# Patient Record
Sex: Female | Born: 1995 | Race: Black or African American | Hispanic: No | Marital: Single | Smoking: Former smoker
Health system: Southern US, Community
[De-identification: ages and names within clinical notes are randomized; demographics above are authoritative.]

## PROBLEM LIST (undated history)

## (undated) ENCOUNTER — Inpatient Hospital Stay: Payer: Self-pay

## (undated) DIAGNOSIS — E669 Obesity, unspecified: Secondary | ICD-10-CM

## (undated) DIAGNOSIS — F32A Depression, unspecified: Secondary | ICD-10-CM

## (undated) DIAGNOSIS — F319 Bipolar disorder, unspecified: Secondary | ICD-10-CM

## (undated) DIAGNOSIS — E66811 Obesity, class 1: Secondary | ICD-10-CM

## (undated) DIAGNOSIS — F419 Anxiety disorder, unspecified: Secondary | ICD-10-CM

---

## 2014-12-05 DIAGNOSIS — R45851 Suicidal ideations: Secondary | ICD-10-CM | POA: Insufficient documentation

## 2014-12-05 DIAGNOSIS — F319 Bipolar disorder, unspecified: Secondary | ICD-10-CM | POA: Insufficient documentation

## 2015-03-15 NOTE — L&D Delivery Note (Addendum)
Delivery Note EDC: 01/16/16 LMP 04/11/15  EGA: 40.3   At 2:44 AM a viable female was delivered via Vaginal, Spontaneous Delivery (Presentation: cephalic).  APGAR: 8, 9; weight 7 lb 1 oz (3204 g).   Placenta status: sponatneous, intact.  Cord: 3 vessels,  with the following complications: none apparent .  Cord pH: not taken  Anesthesia:  none Episiotomy:  none Lacerations:  none Suture Repair: none Est. Blood Loss (mL):  600cc  Mom to postpartum.  Baby to Couplet care / Skin to Skin.  Patient presented with discomfort and was found to be 2cm, as she was in her office visit earlier in the day.  She was given morphine/phenergan for morphine rest.  She slept for a short time and then became more uncomfortable.  She was found to be 5cm, but within 4 minutes she spontaneously ruptured and the baby delivered in the bed. Cord blood was collected. The placenta delivered spontaneously in a 15 minute third stage.  There was some brisk bleeding then clotting after IV pitocin was bolused. Methergine 0.2mg  was given and bleeding became minimal.  Mom and baby recovered skin-to-skin and we sang Happy Birthday to [employer] Baby Caleb and Nurse Huntley DecSara.  Due to precipitous nature, antibiotics were ordered but not given for GBS positive status.    Chelsea C Ward 01/19/2016, 3:11 AM

## 2015-11-05 LAB — OB RESULTS CONSOLE ANTIBODY SCREEN: ANTIBODY SCREEN: NEGATIVE

## 2015-12-01 LAB — OB RESULTS CONSOLE HIV ANTIBODY (ROUTINE TESTING): HIV: NONREACTIVE

## 2015-12-16 ENCOUNTER — Inpatient Hospital Stay
Admission: EM | Admit: 2015-12-16 | Discharge: 2015-12-16 | Disposition: A | Discharge status: Home / Self Care | Payer: Medicaid Other | Attending: Obstetrics and Gynecology | Admitting: Obstetrics and Gynecology

## 2015-12-16 DIAGNOSIS — Z3A35 35 weeks gestation of pregnancy: Secondary | ICD-10-CM | POA: Insufficient documentation

## 2015-12-16 DIAGNOSIS — O4703 False labor before 37 completed weeks of gestation, third trimester: Secondary | ICD-10-CM | POA: Insufficient documentation

## 2015-12-16 LAB — URINE DRUG SCREEN, QUALITATIVE (ARMC ONLY)
Amphetamines, Ur Screen: NOT DETECTED
BARBITURATES, UR SCREEN: NOT DETECTED
Benzodiazepine, Ur Scrn: NOT DETECTED
COCAINE METABOLITE, UR ~~LOC~~: NOT DETECTED
Cannabinoid 50 Ng, Ur ~~LOC~~: NOT DETECTED
MDMA (ECSTASY) UR SCREEN: NOT DETECTED
METHADONE SCREEN, URINE: NOT DETECTED
OPIATE, UR SCREEN: NOT DETECTED
Phencyclidine (PCP) Ur S: NOT DETECTED
TRICYCLIC, UR SCREEN: NOT DETECTED

## 2015-12-16 LAB — CBC
HEMATOCRIT: 31.5 % — AB (ref 35.0–47.0)
HEMOGLOBIN: 10.1 g/dL — AB (ref 12.0–16.0)
MCH: 24.2 pg — ABNORMAL LOW (ref 26.0–34.0)
MCHC: 32 g/dL (ref 32.0–36.0)
MCV: 75.6 fL — AB (ref 80.0–100.0)
Platelets: 199 10*3/uL (ref 150–440)
RBC: 4.17 MIL/uL (ref 3.80–5.20)
RDW: 15.4 % — ABNORMAL HIGH (ref 11.5–14.5)
WBC: 11.2 10*3/uL — AB (ref 3.6–11.0)

## 2015-12-16 LAB — RAPID HIV SCREEN (HIV 1/2 AB+AG)
HIV 1/2 Antibodies: NONREACTIVE
HIV-1 P24 ANTIGEN - HIV24: NONREACTIVE

## 2015-12-16 LAB — WET PREP, GENITAL
CLUE CELLS WET PREP: NONE SEEN
SPERM: NONE SEEN
TRICH WET PREP: NONE SEEN
Yeast Wet Prep HPF POC: NONE SEEN

## 2015-12-16 LAB — DIFFERENTIAL
Basophils Absolute: 0.2 10*3/uL — ABNORMAL HIGH (ref 0–0.1)
Basophils Relative: 2 %
Eosinophils Absolute: 0.1 10*3/uL (ref 0–0.7)
Eosinophils Relative: 1 %
LYMPHS ABS: 2 10*3/uL (ref 1.0–3.6)
LYMPHS PCT: 18 %
MONO ABS: 0.9 10*3/uL (ref 0.2–0.9)
MONOS PCT: 8 %
NEUTROS ABS: 8 10*3/uL — AB (ref 1.4–6.5)
Neutrophils Relative %: 71 %

## 2015-12-16 LAB — CHLAMYDIA/NGC RT PCR (ARMC ONLY)
Chlamydia Tr: NOT DETECTED
N gonorrhoeae: NOT DETECTED

## 2015-12-16 LAB — ABO/RH: ABO/RH(D): O POS

## 2015-12-16 MED ORDER — SODIUM CHLORIDE FLUSH 0.9 % IV SOLN
INTRAVENOUS | Status: AC
  Filled 2015-12-16: qty 10
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
## 2015-12-16 NOTE — Plan of Care (Signed)
Meredith s. cnm here to discuss treatment. Pt states her partner is HIV positive and on medication. Pt states she is supposed to have a csection. Due to the unknown, pt demands to have a csection, even though she has tested negative

## 2015-12-16 NOTE — Progress Notes (Signed)
m sigmon,cnm notified of pt's arrival and complaints.

## 2015-12-16 NOTE — Plan of Care (Signed)
Pt took herself off monitor to go to [facility].
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
## 2015-12-17 LAB — CULTURE, BETA STREP (GROUP B ONLY)

## 2015-12-17 LAB — RUBELLA SCREEN: RUBELLA: 2.41 {index} (ref >0.99)

## 2015-12-17 LAB — RPR: RPR: NONREACTIVE

## 2015-12-17 LAB — VARICELLA ZOSTER ANTIBODY, IGG: Varicella IgG: >4000 index (ref >165)

## 2015-12-17 LAB — HEPATITIS B SURFACE ANTIGEN: Hepatitis B Surface Ag: NEGATIVE
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
## 2016-01-18 ENCOUNTER — Inpatient Hospital Stay
Admission: EM | Admit: 2016-01-18 | Discharge: 2016-01-21 | DRG: 775 | Disposition: A | Discharge status: Home / Self Care | Payer: Medicaid Other | Attending: Obstetrics & Gynecology | Admitting: Obstetrics & Gynecology

## 2016-01-18 DIAGNOSIS — O99824 Streptococcus B carrier state complicating childbirth: Principal | ICD-10-CM | POA: Diagnosis present

## 2016-01-18 DIAGNOSIS — Z3A40 40 weeks gestation of pregnancy: Secondary | ICD-10-CM

## 2016-01-18 DIAGNOSIS — Z880 Allergy status to penicillin: Secondary | ICD-10-CM

## 2016-01-19 DIAGNOSIS — Z3A40 40 weeks gestation of pregnancy: Secondary | ICD-10-CM | POA: Diagnosis not present

## 2016-01-19 DIAGNOSIS — Z880 Allergy status to penicillin: Secondary | ICD-10-CM | POA: Diagnosis not present

## 2016-01-19 DIAGNOSIS — O99824 Streptococcus B carrier state complicating childbirth: Secondary | ICD-10-CM | POA: Diagnosis present

## 2016-01-19 DIAGNOSIS — Z3403 Encounter for supervision of normal first pregnancy, third trimester: Secondary | ICD-10-CM | POA: Diagnosis present

## 2016-01-19 LAB — CBC
HCT: 33.7 % — ABNORMAL LOW (ref 35.0–47.0)
Hemoglobin: 10.7 g/dL — ABNORMAL LOW (ref 12.0–16.0)
MCH: 23.4 pg — AB (ref 26.0–34.0)
MCHC: 31.7 g/dL — ABNORMAL LOW (ref 32.0–36.0)
MCV: 73.9 fL — ABNORMAL LOW (ref 80.0–100.0)
PLATELETS: 252 10*3/uL (ref 150–440)
RBC: 4.56 MIL/uL (ref 3.80–5.20)
RDW: 16.7 % — AB (ref 11.5–14.5)
WBC: 15.8 10*3/uL — AB (ref 3.6–11.0)

## 2016-01-19 LAB — TYPE AND SCREEN
ABO/RH(D): O POS
Antibody Screen: NEGATIVE

## 2016-01-19 MED ORDER — DIBUCAINE 1 % RE OINT: 1.0000 "application " | TOPICAL_OINTMENT | RECTAL | Status: DC | PRN

## 2016-01-19 MED ORDER — BUTORPHANOL TARTRATE 1 MG/ML IJ SOLN
1.0000 mg | Freq: Once | INTRAMUSCULAR | Status: AC
  Administered 2016-01-19: 1 mg via INTRAVENOUS

## 2016-01-19 MED ORDER — OXYCODONE-ACETAMINOPHEN 5-325 MG PO TABS: 1.0000 | ORAL_TABLET | ORAL | Status: DC | PRN

## 2016-01-19 MED ORDER — VANCOMYCIN HCL IN DEXTROSE 1-5 GM/200ML-% IV SOLN
1000.0000 mg | Freq: Once | INTRAVENOUS | Status: DC
  Filled 2016-01-19: qty 200

## 2016-01-19 MED ORDER — DOCUSATE SODIUM 100 MG PO CAPS
100.0000 mg | ORAL_CAPSULE | Freq: Two times a day (BID) | ORAL | Status: DC
  Administered 2016-01-20 – 2016-01-21 (×3): 100 mg via ORAL
  Filled 2016-01-19 (×4): qty 1

## 2016-01-19 MED ORDER — SOD CITRATE-CITRIC ACID 500-334 MG/5ML PO SOLN: 30.0000 mL | ORAL | Status: DC | PRN

## 2016-01-19 MED ORDER — WITCH HAZEL-GLYCERIN EX PADS
1.0000 | MEDICATED_PAD | CUTANEOUS | Status: DC | PRN
Start: 2016-01-19 — End: 2016-01-21

## 2016-01-19 MED ORDER — OXYTOCIN BOLUS FROM INFUSION: 500.0000 mL | Freq: Once | INTRAVENOUS | Status: DC

## 2016-01-19 MED ORDER — METHYLERGONOVINE MALEATE 0.2 MG/ML IJ SOLN
INTRAMUSCULAR | Status: AC
  Filled 2016-01-19: qty 1

## 2016-01-19 MED ORDER — SIMETHICONE 80 MG PO CHEW: 80.0000 mg | CHEWABLE_TABLET | ORAL | Status: DC | PRN

## 2016-01-19 MED ORDER — MEDROXYPROGESTERONE ACETATE 150 MG/ML IM SUSP: 150.0000 mg | Freq: Once | INTRAMUSCULAR | Status: DC

## 2016-01-19 MED ORDER — ONDANSETRON HCL 4 MG PO TABS: 4.0000 mg | ORAL_TABLET | ORAL | Status: DC | PRN

## 2016-01-19 MED ORDER — ACETAMINOPHEN 500 MG PO TABS
1000.0000 mg | ORAL_TABLET | Freq: Four times a day (QID) | ORAL | Status: DC | PRN
  Administered 2016-01-19: 1000 mg via ORAL
  Filled 2016-01-19: qty 2

## 2016-01-19 MED ORDER — BUTORPHANOL TARTRATE 1 MG/ML IJ SOLN: 2.0000 mg | Freq: Once | INTRAMUSCULAR | Status: DC

## 2016-01-19 MED ORDER — ONDANSETRON HCL 4 MG/2ML IJ SOLN: 4.0000 mg | Freq: Four times a day (QID) | INTRAMUSCULAR | Status: DC | PRN

## 2016-01-19 MED ORDER — TETANUS-DIPHTH-ACELL PERTUSSIS 5-2.5-18.5 LF-MCG/0.5 IM SUSP: 0.5000 mL | Freq: Once | INTRAMUSCULAR | Status: DC

## 2016-01-19 MED ORDER — ONDANSETRON HCL 4 MG/2ML IJ SOLN: 4.0000 mg | INTRAMUSCULAR | Status: DC | PRN

## 2016-01-19 MED ORDER — LIDOCAINE HCL (PF) 1 % IJ SOLN: 30.0000 mL | INTRAMUSCULAR | Status: DC | PRN

## 2016-01-19 MED ORDER — LACTATED RINGERS IV SOLN: 500.0000 mL | INTRAVENOUS | Status: DC | PRN

## 2016-01-19 MED ORDER — METHYLERGONOVINE MALEATE 0.2 MG PO TABS
0.2000 mg | ORAL_TABLET | Freq: Once | ORAL | Status: AC
  Administered 2016-01-19: 0.2 mg via ORAL
  Filled 2016-01-19: qty 1

## 2016-01-19 MED ORDER — BUTORPHANOL TARTRATE 1 MG/ML IJ SOLN
INTRAMUSCULAR | Status: AC
  Administered 2016-01-19: 1 mg via INTRAVENOUS
  Filled 2016-01-19: qty 2

## 2016-01-19 MED ORDER — LACTATED RINGERS IV SOLN: INTRAVENOUS | Status: DC

## 2016-01-19 MED ORDER — PRENATAL MULTIVITAMIN CH
1.0000 | ORAL_TABLET | Freq: Every day | ORAL | Status: DC
  Filled 2016-01-19 (×3): qty 1

## 2016-01-19 MED ORDER — ACETAMINOPHEN 325 MG PO TABS: 650.0000 mg | ORAL_TABLET | ORAL | Status: DC | PRN

## 2016-01-19 MED ORDER — OXYTOCIN 40 UNITS IN LACTATED RINGERS INFUSION - SIMPLE MED
2.5000 [IU]/h | INTRAVENOUS | Status: DC
  Filled 2016-01-19: qty 1000

## 2016-01-19 MED ORDER — IBUPROFEN 600 MG PO TABS
600.0000 mg | ORAL_TABLET | Freq: Four times a day (QID) | ORAL | Status: DC
  Administered 2016-01-19 – 2016-01-21 (×8): 600 mg via ORAL
  Filled 2016-01-19 (×9): qty 1

## 2016-01-19 MED ORDER — DIPHENHYDRAMINE HCL 25 MG PO CAPS: 25.0000 mg | ORAL_CAPSULE | Freq: Four times a day (QID) | ORAL | Status: DC | PRN

## 2016-01-19 MED ORDER — MORPHINE SULFATE (PF) 10 MG/ML IV SOLN
5.0000 mg | Freq: Once | INTRAVENOUS | Status: AC | PRN
  Administered 2016-01-19: 5 mg via INTRAMUSCULAR
  Filled 2016-01-19: qty 1

## 2016-01-19 MED ORDER — COCONUT OIL OIL: 1.0000 "application " | TOPICAL_OIL | Status: DC | PRN

## 2016-01-19 MED ORDER — OXYCODONE-ACETAMINOPHEN 5-325 MG PO TABS: 2.0000 | ORAL_TABLET | ORAL | Status: DC | PRN

## 2016-01-19 MED ORDER — PROMETHAZINE HCL 25 MG/ML IJ SOLN
25.0000 mg | Freq: Once | INTRAMUSCULAR | Status: AC | PRN
  Administered 2016-01-19: 25 mg via INTRAMUSCULAR
  Filled 2016-01-19: qty 1

## 2016-01-19 NOTE — OB Triage Note (Signed)
Pt presents to L&D with c/o contractions all day, worsening at 9pm. Denies LOF or vaginal bleeding. Reports decreased fetal movement today. Reports Dr Elesa MassedWard stripped her membranes today at the office. EFM applied and explained. Plan to monitor fetal and maternal well being and assess for labor. Since efm applied, pt reports feeling several fetal movements.
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
## 2016-01-20 LAB — CBC
HCT: 28.8 % — ABNORMAL LOW (ref 35.0–47.0)
HEMOGLOBIN: 9.1 g/dL — AB (ref 12.0–16.0)
MCH: 23.2 pg — AB (ref 26.0–34.0)
MCHC: 31.4 g/dL — ABNORMAL LOW (ref 32.0–36.0)
MCV: 73.7 fL — AB (ref 80.0–100.0)
Platelets: 185 10*3/uL (ref 150–440)
RBC: 3.91 MIL/uL (ref 3.80–5.20)
RDW: 16.6 % — ABNORMAL HIGH (ref 11.5–14.5)
WBC: 11.2 10*3/uL — AB (ref 3.6–11.0)

## 2016-01-20 LAB — URINE DRUG SCREEN, QUALITATIVE (ARMC ONLY)
Amphetamines, Ur Screen: NOT DETECTED
BARBITURATES, UR SCREEN: NOT DETECTED
Benzodiazepine, Ur Scrn: NOT DETECTED
CANNABINOID 50 NG, UR ~~LOC~~: NOT DETECTED
COCAINE METABOLITE, UR ~~LOC~~: NOT DETECTED
MDMA (Ecstasy)Ur Screen: NOT DETECTED
Methadone Scn, Ur: NOT DETECTED
Opiate, Ur Screen: NOT DETECTED
PHENCYCLIDINE (PCP) UR S: NOT DETECTED
TRICYCLIC, UR SCREEN: NOT DETECTED

## 2016-01-20 LAB — RPR: RPR: NONREACTIVE

## 2016-01-20 MED ORDER — IBUPROFEN 600 MG PO TABS: 600.0000 mg | ORAL_TABLET | Freq: Four times a day (QID) | ORAL | 0 refills | Status: DC

## 2016-01-20 MED ORDER — PRENATAL MULTIVITAMIN CH: 1.0000 | ORAL_TABLET | Freq: Every day | ORAL | 11 refills | Status: DC

## 2016-01-20 NOTE — Progress Notes (Signed)
Post Partum Day 1 Subjective: no complaints  Objective: Blood pressure 104/64, pulse 65, temperature 98.4 F (36.9 C), temperature source Oral, resp. rate 18, height 5\' 4"  (1.626 m), weight 216 lb (98 kg), last menstrual period 04/11/2015, SpO2 100 %, unknown if currently breastfeeding.  Physical Exam:  General: alert and cooperative Lochia: appropriate Uterine Fundus: firm Incision: n/a DVT Evaluation: No evidence of DVT seen on physical exam.   Recent Labs  01/19/16 0240 01/20/16 0558  HGB 10.7* 9.1*  HCT 33.7* 28.8*    Assessment/Plan: Plan for discharge tomorrow GBS not adequate tx   LOS: 1 day   SCHERMERHORN,THOMAS 01/20/2016, 10:03 AM

## 2016-01-21 NOTE — Discharge Instructions (Signed)
Care After Vaginal Delivery °Congratulations on your new baby!! ° °Refer to this sheet in the next few weeks. These discharge instructions provide you with information on caring for yourself after delivery. Your caregiver may also give you specific instructions. Your treatment has been planned according to the most current medical practices available, but problems sometimes occur. Call your caregiver if you have any problems or questions after you go home. ° °HOME CARE INSTRUCTIONS °· Take over-the-counter or prescription medicines only as directed by your caregiver or pharmacist. °· Do not drink alcohol, especially if you are breastfeeding or taking medicine to relieve pain. °· Do not chew or smoke tobacco. °· Do not use illegal drugs. °· Continue to use good perineal care. Good perineal care includes: °¨ Wiping your perineum from front to back. °¨ Keeping your perineum clean. °· Do not use tampons or douche until your caregiver says it is okay. °· Shower, wash your hair, and take tub baths as directed by your caregiver. °· Wear a well-fitting bra that provides breast support. °· Eat healthy foods. °· Drink enough fluids to keep your urine clear or pale yellow. °· Eat high-fiber foods such as whole grain cereals and breads, brown rice, beans, and fresh fruits and vegetables every day. These foods may help prevent or relieve constipation. °· Follow your caregiver's recommendations regarding resumption of activities such as climbing stairs, driving, lifting, exercising, or traveling. Specifically, no driving for two weeks, so that you are comfortable reacting quickly in an emergency. °· Talk to your caregiver about resuming sexual activities. Resumption of sexual activities is dependent upon your risk of infection, your rate of healing, and your comfort and desire to resume sexual activity. Usually we recommend waiting about six weeks, or until your bleeding stops and you are interested in sex. °· Try to have someone  help you with your household activities and your newborn for at least a few days after you leave the hospital. Even longer is better. °· Rest as much as possible. Try to rest or take a nap when your newborn is sleeping. Sleep deprivation can be very hard after delivery. °· Increase your activities gradually. °· Keep all of your scheduled postpartum appointments. It is very important to keep your scheduled follow-up appointments. At these appointments, your caregiver will be checking to make sure that you are healing physically and emotionally. ° °SEEK MEDICAL CARE IF:  °· You are passing large clots from your vagina.  °· You have a foul smelling discharge from your vagina. °· You have trouble urinating. °· You are urinating frequently. °· You have pain when you urinate. °· You have a change in your bowel movements. °· You have increasing redness, pain, or swelling near your vaginal incision (episiotomy) or vaginal tear. °· You have pus draining from your episiotomy or vaginal tear. °· Your episiotomy or vaginal tear is separating. °· You have painful, hard, or reddened breasts. °· You have a severe headache. °· You have blurred vision or see spots. °· You feel sad or depressed. °· You have thoughts of hurting yourself or your newborn. °· You have questions about your care, the care of your newborn, or medicines. °· You are dizzy or light-headed. °· You have a rash. °· You have nausea or vomiting. °· You were breastfeeding and have not had a menstrual period within 12 weeks after you stopped breastfeeding. °· You are not breastfeeding and have not had a menstrual period by the 12th week after delivery. °· You   have a fever. ° °SEEK IMMEDIATE MEDICAL CARE IF:  °· You have persistent pain. °· You have chest pain. °· You have shortness of breath. °· You faint. °· You have leg pain. °· You have stomach pain. °· Your vaginal bleeding saturates two or more sanitary pads in 1 hour. ° °MAKE SURE YOU:  °· Understand these  instructions. °· Will get help right away if you are not doing well or get worse. °·  °Document Released: 02/26/2000 Document Revised: 07/15/2013 Document Reviewed: 10/26/2011 ° °ExitCare® Patient Information ©2015 [employer]. This information is not intended to replace advice given to you by your health care provider. Make sure you discuss any questions you have with your health care provider. ° °

## 2016-01-21 NOTE — Progress Notes (Signed)
Post Partum Day #2 Subjective: no complaints  Objective: Blood pressure 98/61, pulse 68, temperature 98 F (36.7 C), temperature source Oral, resp. rate 18, height 5\' 4"  (1.626 m), weight 98 kg (216 lb), last menstrual period 04/11/2015, SpO2 100 %, unknown if currently breastfeeding.  Physical Exam:  General: alert and cooperative Lochia: appropriate, no clots Uterine Fundus: firm, U-2 DVT Evaluation: No evidence of DVT seen on physical exam.   Recent Labs  01/19/16 0240 01/20/16 0558  HGB 10.7* 9.1*  HCT 33.7* 28.8*    Assessment/Plan: Discharge home  FU 6 weeks for Nexplaon   LOS: 2 days   Sharon Dean 01/21/2016, 11:20 AM

## 2016-01-21 NOTE — Progress Notes (Signed)
Patient understands all discharge instructions and the need to make follow up appointments. Patient discharge via wheelchair with auxillary. 

## 2017-03-14 NOTE — L&D Delivery Note (Addendum)
Delivery Note At 1:52 PM a viable female infant was delivered via Vaginal, Spontaneous (Presentation: occiput anterior).  APGAR: 8, 9; weight 8 lb 3.2 oz (3720 g).   Placenta status: delivered spontaneously, intact.  Cord: 3VC with the following complications: none.  Cord pH: n/a  Anesthesia:  none Episiotomy: None Lacerations: None Suture Repair: n/a Calculated Blood Loss (mL):  530 mL  Mom to postpartum.  Baby to Couplet care / Skin to Skin.  Called to see patient.  Mom pushed to deliver a viable female infant.  The head followed by shoulders, which delivered without difficulty, and the rest of the body.  A single nuchal cord noted and delivered through.  Mom delivered in the hands and knees position.  Baby on drap below maternal perineum.  Cord clamped and cut after > 1 min delay.  Cord blood obtained.  Placenta delivered spontaneously, intact, with a 3-vessel cord.  No vaginal, cervical, or perineal lacerations. All counts correct.  Hemostasis obtained with IV pitocin and fundal massage. QBL 530 mL. Quantitative blood loss pending.   Thomasene MohairStephen Babita Amaker, MD 10/19/2017, 2:08 PM

## 2017-04-23 ENCOUNTER — Emergency Department: Payer: Self-pay

## 2017-04-23 ENCOUNTER — Encounter: Payer: Self-pay | Admitting: Emergency Medicine

## 2017-04-23 ENCOUNTER — Emergency Department
Admission: EM | Admit: 2017-04-23 | Discharge: 2017-04-23 | Disposition: A | Discharge status: Home / Self Care | Payer: Self-pay | Attending: Emergency Medicine | Admitting: Emergency Medicine

## 2017-04-23 DIAGNOSIS — R1031 Right lower quadrant pain: Secondary | ICD-10-CM | POA: Insufficient documentation

## 2017-04-23 DIAGNOSIS — O9989 Other specified diseases and conditions complicating pregnancy, childbirth and the puerperium: Secondary | ICD-10-CM | POA: Insufficient documentation

## 2017-04-23 DIAGNOSIS — O2341 Unspecified infection of urinary tract in pregnancy, first trimester: Secondary | ICD-10-CM | POA: Insufficient documentation

## 2017-04-23 DIAGNOSIS — R109 Unspecified abdominal pain: Secondary | ICD-10-CM

## 2017-04-23 DIAGNOSIS — O99341 Other mental disorders complicating pregnancy, first trimester: Secondary | ICD-10-CM | POA: Insufficient documentation

## 2017-04-23 DIAGNOSIS — O26899 Other specified pregnancy related conditions, unspecified trimester: Secondary | ICD-10-CM

## 2017-04-23 DIAGNOSIS — Z3A12 12 weeks gestation of pregnancy: Secondary | ICD-10-CM | POA: Insufficient documentation

## 2017-04-23 DIAGNOSIS — F319 Bipolar disorder, unspecified: Secondary | ICD-10-CM | POA: Insufficient documentation

## 2017-04-23 DIAGNOSIS — O26891 Other specified pregnancy related conditions, first trimester: Secondary | ICD-10-CM

## 2017-04-23 DIAGNOSIS — N390 Urinary tract infection, site not specified: Secondary | ICD-10-CM

## 2017-04-23 HISTORY — DX: Bipolar disorder, unspecified: F31.9

## 2017-04-23 LAB — COMPREHENSIVE METABOLIC PANEL
ALBUMIN: 3.8 g/dL (ref 3.5–5.0)
ALK PHOS: 47 U/L (ref 38–126)
ALT: 10 U/L — ABNORMAL LOW (ref 14–54)
ANION GAP: 9 (ref 5–15)
AST: 15 U/L (ref 15–41)
BUN: 7 mg/dL (ref 6–20)
CALCIUM: 9.4 mg/dL (ref 8.9–10.3)
CO2: 22 mmol/L (ref 22–32)
Chloride: 107 mmol/L (ref 101–111)
Creatinine, Ser: 0.73 mg/dL (ref 0.44–1.00)
GFR calc Af Amer: >60 mL/min (ref >60)
GFR calc non Af Amer: >60 mL/min (ref >60)
GLUCOSE: 88 mg/dL (ref 65–99)
Potassium: 3.4 mmol/L — ABNORMAL LOW (ref 3.5–5.1)
SODIUM: 138 mmol/L (ref 135–145)
Total Bilirubin: 0.4 mg/dL (ref 0.3–1.2)
Total Protein: 7.7 g/dL (ref 6.5–8.1)

## 2017-04-23 LAB — URINALYSIS, COMPLETE (UACMP) WITH MICROSCOPIC
Bilirubin Urine: NEGATIVE
Glucose, UA: NEGATIVE mg/dL
Hgb urine dipstick: NEGATIVE
Ketones, ur: 20 mg/dL — AB
Nitrite: NEGATIVE
PROTEIN: NEGATIVE mg/dL
Specific Gravity, Urine: 1.02 (ref 1.005–1.030)
pH: 5 (ref 5.0–8.0)

## 2017-04-23 LAB — CBC
HEMATOCRIT: 35.6 % (ref 35.0–47.0)
HEMOGLOBIN: 11.4 g/dL — AB (ref 12.0–16.0)
MCH: 24.7 pg — ABNORMAL LOW (ref 26.0–34.0)
MCHC: 32.2 g/dL (ref 32.0–36.0)
MCV: 76.6 fL — ABNORMAL LOW (ref 80.0–100.0)
Platelets: 224 10*3/uL (ref 150–440)
RBC: 4.64 MIL/uL (ref 3.80–5.20)
RDW: 16 % — ABNORMAL HIGH (ref 11.5–14.5)
WBC: 8.5 10*3/uL (ref 3.6–11.0)

## 2017-04-23 LAB — HCG, QUANTITATIVE, PREGNANCY: HCG, BETA CHAIN, QUANT, S: 151028 m[IU]/mL — AB (ref <5)

## 2017-04-23 LAB — ABO/RH: ABO/RH(D): O POS

## 2017-04-23 LAB — POCT PREGNANCY, URINE: PREG TEST UR: POSITIVE — AB

## 2017-04-23 MED ORDER — ONDANSETRON 4 MG PO TBDP: 4.0000 mg | ORAL_TABLET | Freq: Three times a day (TID) | ORAL | 0 refills | Status: DC | PRN

## 2017-04-23 MED ORDER — NITROFURANTOIN MONOHYD MACRO 100 MG PO CAPS
100.0000 mg | ORAL_CAPSULE | Freq: Once | ORAL | Status: AC
  Administered 2017-04-23: 100 mg via ORAL
  Filled 2017-04-23: qty 1

## 2017-04-23 MED ORDER — NITROFURANTOIN MONOHYD MACRO 100 MG PO CAPS: 100.0000 mg | ORAL_CAPSULE | Freq: Two times a day (BID) | ORAL | 0 refills | Status: DC

## 2017-04-23 NOTE — ED Notes (Signed)
Patient transported to Ultrasound 

## 2017-04-23 NOTE — ED Notes (Addendum)
Pt reports recent dx of UTI unable to take all antibiotics, reports originally had RLQ pain, now right lower back pain, denies any changes in urine.  Pt reports still has appendix.  Pt reports G3P2

## 2017-04-23 NOTE — ED Provider Notes (Signed)
[facility] Emergency Department Provider Note   ____________________________________________   First MD Initiated Contact with Patient 04/23/17 [phone]     (approximate)  I have reviewed the triage vital signs and the nursing notes.   HISTORY  Chief Complaint Abdominal Pain    HPI Sharon Dean is a 22 y.o. female G3P2 approximately [redacted] weeks pregnant who [facility] ED from jail with complaints of dysuria and right lower back pain.  Patient was seen at [facility] ED 4 days ago for similar symptoms.  States she received antibiotics for STD and was prescribed an antibiotic for UTI which is supposed to be taken twice daily (patient does not recall name of medicine).  She was also prescribed an antiemetic.  Patient admits she did not get her medications filled.  States she was having right lower cramping earlier when she was arguing with her "baby daddy".  Denies being kicked, punched, shoved or otherwise injured in her belly.  Thinks the cramping came from "stress".  Currently denies any pelvic cramping or abdominal pain.  Denies recent fever, chills, chest pain, shortness of breath, vomiting, diarrhea.  Has had some nausea this pregnancy.  Last sexual intercourse over 1 month ago.  Denies vaginal bleeding.   Past Medical History:  Diagnosis Date  . Bipolar 1 disorder [facility](HCC)     Patient Active Problem List   Diagnosis Date Noted  . Labor and delivery, indication for care 01/19/2016  . Precipitous delivery 01/19/2016  . Normal vaginal delivery 01/19/2016  . Threatened preterm labor, third trimester 12/16/2015    History reviewed. No pertinent surgical history.  Prior to Admission medications   Medication Sig Start Date End Date Taking? Authorizing Provider  ibuprofen (ADVIL,MOTRIN) 600 MG tablet Take 1 tablet (600 mg total) by mouth every 6 (six) hours. 01/20/16   Schermerhorn, Ihor Austinhomas J, MD  nitrofurantoin, macrocrystal-monohydrate, (MACROBID) 100 MG capsule  Take 1 capsule (100 mg total) by mouth 2 (two) times daily. 04/23/17   Irean HongSung, Jade J, MD  ondansetron (ZOFRAN ODT) 4 MG disintegrating tablet Take 1 tablet (4 mg total) by mouth every 8 (eight) hours as needed for nausea or vomiting. 04/23/17   Irean HongSung, Jade J, MD  Prenatal Vit-Fe Fumarate-FA (PRENATAL MULTIVITAMIN) TABS tablet Take 1 tablet by mouth daily at 12 noon. 01/20/16   Schermerhorn, Ihor Austinhomas J, MD    Allergies Penicillins  No family history on file.  Social History Social History   Tobacco Use  . Smoking status: Never Smoker  . Smokeless tobacco: Never Used  Substance Use Topics  . Alcohol use: No    Frequency: Never  . Drug use: Not on file    Review of Systems  Constitutional: No fever/chills. Eyes: No visual changes. ENT: No sore throat. Cardiovascular: Denies chest pain. Respiratory: Denies shortness of breath. Gastrointestinal: No abdominal pain.  No nausea, no vomiting.  No diarrhea.  No constipation. Genitourinary: Positive for dysuria. Musculoskeletal: Positive for back pain. Skin: Negative for rash. Neurological: Negative for headaches, focal weakness or numbness.   ____________________________________________   PHYSICAL EXAM:  VITAL SIGNS: ED Triage Vitals  Enc Vitals Group     BP 04/23/17 0248 109/66     Pulse Rate 04/23/17 0248 74     Resp 04/23/17 0248 18     Temp 04/23/17 0248 98.8 F (37.1 C)     Temp Source 04/23/17 0248 Oral     SpO2 04/23/17 0248 100 %     Weight 04/23/17 0249 215 lb (  97.5 kg)     Height 04/23/17 0249 5\' 4"  (1.626 m)     Head Circumference --      Peak Flow --      Pain Score 04/23/17 0248 6     Pain Loc --      Pain Edu? --      Excl. in GC? --     Constitutional: Alert and oriented. Well appearing and in no acute distress. Eyes: Conjunctivae are normal. PERRL. EOMI. Head: Atraumatic. Nose: No congestion/rhinnorhea. Mouth/Throat: Mucous membranes are moist.  Oropharynx non-erythematous. Neck: No stridor.     Cardiovascular: Normal rate, regular rhythm. Grossly normal heart sounds.  Good peripheral circulation. Respiratory: Normal respiratory effort.  No retractions. Lungs CTAB. Gastrointestinal: Soft and nontender to light or deep palpation.  Specifically, there is no pain at McBurney's point.  No distention. No abdominal bruits. No CVA tenderness. Musculoskeletal: No lower extremity tenderness nor edema.  No joint effusions. Neurologic:  Normal speech and language. No gross focal neurologic deficits are appreciated. No gait instability. Skin:  Skin is warm, dry and intact. No rash noted. Psychiatric: Mood and affect are normal. Speech and behavior are normal.  ____________________________________________   LABS (all labs ordered are listed, but only abnormal results are displayed)  Labs Reviewed  COMPREHENSIVE METABOLIC PANEL - Abnormal; Notable for the following components:      Result Value   Potassium 3.4 (*)    ALT 10 (*)    All other components within normal limits  CBC - Abnormal; Notable for the following components:   Hemoglobin 11.4 (*)    MCV 76.6 (*)    MCH 24.7 (*)    RDW 16.0 (*)    All other components within normal limits  URINALYSIS, COMPLETE (UACMP) WITH MICROSCOPIC - Abnormal; Notable for the following components:   Color, Urine YELLOW (*)    APPearance CLOUDY (*)    Ketones, ur 20 (*)    Leukocytes, UA MODERATE (*)    Bacteria, UA RARE (*)    Squamous Epithelial / LPF 6-30 (*)    All other components within normal limits  HCG, QUANTITATIVE, PREGNANCY - Abnormal; Notable for the following components:   hCG, Beta Chain, Quant, S 151,028 (*)    All other components within normal limits  POCT PREGNANCY, URINE - Abnormal; Notable for the following components:   Preg Test, Ur POSITIVE (*)    All other components within normal limits  POC URINE PREG, ED  ABO/RH    ____________________________________________  EKG  None ____________________________________________  RADIOLOGY  ED MD interpretation: Viable IUP  Official radiology report(s): US Ob Comp Less 14 Wks  Result Date: 04/23/2017 CLINICAL DATA:  Acute onset of generalized abdominal pain. EXAM: OBSTETRIC <14 WK ULTRASOUND TECHNIQUE: Transabdominal ultrasound was performed for evaluation of the gestation as well as the maternal uterus and adnexal regions. COMPARISON:  None. FINDINGS: Intrauterine gestational sac: Single; visualized and normal in shape. Yolk sac:  No Embryo:  Yes Cardiac Activity: Yes Heart Rate: 150 bpm CRL: 5.87 cm   12 w 3 d                  [place] EDC: 11/02/2017 Subchorionic hemorrhage:  None visualized. Maternal uterus/adnexae: The uterus is otherwise unremarkable. The right ovary is not visualized. The left ovary is unremarkable in appearance, measuring 3.1 x 2.1 x 1.7 cm. No suspicious adnexal masses are seen; there is no evidence for ovarian torsion. No free fluid is seen within the pelvic cul-de-sac.  IMPRESSION: Single live intrauterine pregnancy noted, with a crown-rump length of 5.9 cm, corresponding to a gestational age of [redacted] weeks 3 days. This does not match the gestational age by LMP, and reflects a new estimated date of delivery of November 02, 2017. Electronically Signed   By: Roanna Raider M.D.   On: 04/23/2017 05:06    ____________________________________________   PROCEDURES  Procedure(s) performed:   Pelvic exam: External exam within normal limits without rashes, lesions or vesicles. Speculum exam reveals mild white discharge. No bleeding. Cervical os closed. Normal bimanual exam.   Procedures  Critical Care performed: No  ____________________________________________   INITIAL IMPRESSION / ASSESSMENT AND PLAN / ED COURSE  As part of my medical decision making, I reviewed the following data within the electronic MEDICAL RECORD NUMBER Nursing notes reviewed  and incorporated, Labs reviewed, Old chart reviewed, Radiograph reviewed and Notes from prior ED visits.   G3P2 approximately [redacted] weeks pregnant by dates presenting with dysuria and back pain. Differential diagnosis includes, but is not limited to, ovarian cyst, ovarian torsion, acute appendicitis, diverticulitis, urinary tract infection/pyelonephritis, endometriosis, bowel obstruction, colitis, renal colic, gastroenteritis, hernia, fibroids, endometriosis, pregnancy related pain including ectopic pregnancy, etc.  I am unable to view patient's records from [facility].  From what she describes, she received antibiotic treatment for STDs, normal pelvic ultrasound, and prescription for antibiotic and antiemetic which she did not fill.  Lab work and urinalysis remarkable for UTI.  She has no abdominal or pelvic tenderness on palpation.  Low suspicion for appendicitis.  Patient is going to jail and will be released Monday.  Will discharge with prescription for Macrobid and Zofran.  Discharge order for jail to administer second dose of Macrobid later this evening.  I have strongly encouraged the patient to fill her prescriptions and follow-up with OB/GYN in a week.  Strict return precautions given.  Patient verbalizes understanding and agrees with plan of care.      ____________________________________________   FINAL CLINICAL IMPRESSION(S) / ED DIAGNOSES  Final diagnoses:  Lower urinary tract infectious disease  Abdominal pain during pregnancy, first trimester     ED Discharge Orders        Ordered    ondansetron (ZOFRAN ODT) 4 MG disintegrating tablet  Every 8 hours PRN     04/23/17 0531    nitrofurantoin, macrocrystal-monohydrate, (MACROBID) 100 MG capsule  2 times daily     04/23/17 0531       Note:  This document was prepared using Dragon voice recognition software and may include unintentional dictation errors.    Irean Hong, MD 04/23/17 [phone]

## 2017-04-23 NOTE — Discharge Instructions (Addendum)
1.  Please give 1 dose of Macrobid 100 mg tonight. 2.  Please fill your prescription for antibiotic and nausea medicine upon your released from jail on Monday. 3.  Return to the ER for worsening symptoms, persistent vomiting, fever, bleeding or other concerns.

## 2017-04-23 NOTE — ED Triage Notes (Signed)
Patient states that she is [redacted] weeks pregnant. Patient with complaint of right lower abdominal cramping with pain radiating to her right lower back that started today. Patient states that she was seen at another er 4 days ago with similar symptoms and was diagnosed with a UTI. Patient denies any urinary symptoms.  Patient states that she was unable to take all of her antibiotics.

## 2017-05-11 ENCOUNTER — Encounter: Payer: Self-pay | Admitting: Emergency Medicine

## 2017-05-11 ENCOUNTER — Emergency Department
Admission: EM | Admit: 2017-05-11 | Discharge: 2017-05-11 | Disposition: A | Discharge status: Home / Self Care | Attending: Emergency Medicine | Admitting: Emergency Medicine

## 2017-05-11 DIAGNOSIS — B9689 Other specified bacterial agents as the cause of diseases classified elsewhere: Secondary | ICD-10-CM

## 2017-05-11 DIAGNOSIS — Z3A15 15 weeks gestation of pregnancy: Secondary | ICD-10-CM | POA: Insufficient documentation

## 2017-05-11 DIAGNOSIS — O98312 Other infections with a predominantly sexual mode of transmission complicating pregnancy, second trimester: Secondary | ICD-10-CM | POA: Insufficient documentation

## 2017-05-11 DIAGNOSIS — Z79899 Other long term (current) drug therapy: Secondary | ICD-10-CM | POA: Insufficient documentation

## 2017-05-11 DIAGNOSIS — N760 Acute vaginitis: Secondary | ICD-10-CM | POA: Insufficient documentation

## 2017-05-11 LAB — WET PREP, GENITAL
SPERM: NONE SEEN
Trich, Wet Prep: NONE SEEN
YEAST WET PREP: NONE SEEN

## 2017-05-11 LAB — URINALYSIS, COMPLETE (UACMP) WITH MICROSCOPIC
BILIRUBIN URINE: NEGATIVE
GLUCOSE, UA: NEGATIVE mg/dL
HGB URINE DIPSTICK: NEGATIVE
Ketones, ur: NEGATIVE mg/dL
NITRITE: NEGATIVE
PH: 6 (ref 5.0–8.0)
Protein, ur: NEGATIVE mg/dL
SPECIFIC GRAVITY, URINE: 1.027 (ref 1.005–1.030)

## 2017-05-11 MED ORDER — CLINDAMYCIN HCL 300 MG PO CAPS: 300.0000 mg | ORAL_CAPSULE | Freq: Three times a day (TID) | ORAL | 0 refills | Status: DC

## 2017-05-11 NOTE — ED Provider Notes (Signed)
[facility] Emergency Department Provider Note  ____________________________________________   None    (approximate)  I have reviewed the triage vital signs and the nursing notes.   HISTORY  Chief Complaint SEXUALLY TRANSMITTED DISEASE    HPI Maylie Ashton is a 22 y.o. female reports emergency department stating she has been reexposed to a STD.  She states she was treated at the hospital for this.  But then she went home when she got out of jail and had sex with her boyfriend.  Since then she has had a greatest discharge.  She states they need to both be checked.  She had trichomonas on the last visit.  She denies any vaginal cramping or bleeding.  She is [redacted] weeks pregnant.  Past Medical History:  Diagnosis Date  . Bipolar 1 disorder [facility])     Patient Active Problem List   Diagnosis Date Noted  . Labor and delivery, indication for care 01/19/2016  . Precipitous delivery 01/19/2016  . Normal vaginal delivery 01/19/2016  . Threatened preterm labor, third trimester 12/16/2015    History reviewed. No pertinent surgical history.  Prior to Admission medications   Medication Sig Start Date End Date Taking? Authorizing Provider  clindamycin (CLEOCIN) 300 MG capsule Take 1 capsule (300 mg total) by mouth 3 (three) times daily. 05/11/17   Yessica Putnam, Roselyn Bering, PA-C  ibuprofen (ADVIL,MOTRIN) 600 MG tablet Take 1 tablet (600 mg total) by mouth every 6 (six) hours. 01/20/16   Schermerhorn, Ihor Austin, MD  nitrofurantoin, macrocrystal-monohydrate, (MACROBID) 100 MG capsule Take 1 capsule (100 mg total) by mouth 2 (two) times daily. 04/23/17   Irean Hong, MD  ondansetron (ZOFRAN ODT) 4 MG disintegrating tablet Take 1 tablet (4 mg total) by mouth every 8 (eight) hours as needed for nausea or vomiting. 04/23/17   Irean Hong, MD  Prenatal Vit-Fe Fumarate-FA (PRENATAL MULTIVITAMIN) TABS tablet Take 1 tablet by mouth daily at 12 noon. 01/20/16   Schermerhorn, Ihor Austin, MD     Allergies Penicillins  No family history on file.  Social History Social History   Tobacco Use  . Smoking status: Never Smoker  . Smokeless tobacco: Never Used  Substance Use Topics  . Alcohol use: No    Frequency: Never  . Drug use: Not on file    Review of Systems  Constitutional: No fever/chills Eyes: No visual changes. ENT: No sore throat. Respiratory: Denies cough Genitourinary: Negative for dysuria.  Positive for vaginal discharge Musculoskeletal: Negative for back pain. Skin: Negative for rash.    ____________________________________________   PHYSICAL EXAM:  VITAL SIGNS: ED Triage Vitals  Enc Vitals Group     BP 05/11/17 2200 (!) 113/54     Pulse Rate 05/11/17 2200 66     Resp 05/11/17 2200 18     Temp 05/11/17 2200 98.7 F (37.1 C)     Temp Source 05/11/17 2200 Oral     SpO2 05/11/17 2200 100 %     Weight 05/11/17 2202 215 lb (97.5 kg)     Height 05/11/17 2202 5\' 5"  (1.651 m)     Head Circumference --      Peak Flow --      Pain Score --      Pain Loc --      Pain Edu? --      Excl. in GC? --     Constitutional: Alert and oriented. Well appearing and in no acute distress. Eyes: Conjunctivae are normal.  Head: Atraumatic.  Nose: No congestion/rhinnorhea. Mouth/Throat: Mucous membranes are moist.   Cardiovascular: Normal rate, regular rhythm. Respiratory: Normal respiratory effort.  No retractions GU: Pelvic exam shows no lesions on the vaginal area.  Cervix is normal.  There is a mild white discharge in the vaginal vault.  Swabs for wet prep, GC/chlamydia were obtained  musculoskeletal: FROM all extremities, warm and well perfused Neurologic:  Normal speech and language.  Skin:  Skin is warm, dry and intact. No rash noted. Psychiatric: Mood and affect are normal. Speech and behavior are normal.  ____________________________________________   LABS (all labs ordered are listed, but only abnormal results are displayed)  Labs Reviewed   WET PREP, GENITAL - Abnormal; Notable for the following components:      Result Value   Clue Cells Wet Prep HPF POC PRESENT (*)    WBC, Wet Prep HPF POC FEW (*)    All other components within normal limits  URINALYSIS, COMPLETE (UACMP) WITH MICROSCOPIC - Abnormal; Notable for the following components:   Color, Urine YELLOW (*)    APPearance HAZY (*)    Leukocytes, UA TRACE (*)    Bacteria, UA RARE (*)    Squamous Epithelial / LPF 6-30 (*)    All other components within normal limits  CHLAMYDIA/NGC RT PCR ([facility] ONLY)   ____________________________________________   ____________________________________________  RADIOLOGY    ____________________________________________   PROCEDURES  Procedure(s) performed: No  Procedures    ____________________________________________   INITIAL IMPRESSION / ASSESSMENT AND PLAN / ED COURSE  Pertinent labs & imaging results that were available during my care of the patient were reviewed by me and considered in my medical decision making (see chart for details).  Patient is 22 year old female complaining of vaginal discharge.  She had been diagnosed with trichomonas in February 04/17/17.  She states she was given a bunch of pills at that time.  She took those and that was given more for UTI.  Then she went to jail and she went home and had sex with her boyfriend again.  She states she is been reinfected.  Physical exam patient appears well pelvic exam shows a scant white discharge.  Wet prep shows clue cells and white blood cells.  Trichomonas is negative.  GC and Chlamydia are pending.  Explained the results to the patient.  Explained that the proper treatment since she is pregnant and is Artie had one dose of metronidazole would be to give her clindamycin 300 mg twice daily for 7 days.  Patient states she understands to take this medication.  Explained to her that the nurse would call her for GC and Chlamydia are positive.  She  should follow-up with the health department if this test comes back positive.  She states she understands will comply with our instructions.  She was discharged in stable condition.  Both parties were told not to have sex for at least 2 weeks.  They should use condoms.  They state they understand.     As part of my medical decision making, I reviewed the following data within the electronic MEDICAL RECORD NUMBER Nursing notes reviewed and incorporated, Labs reviewed wet prep positive for clue cells, Notes from prior ED visits and [place] Controlled Substance Database  ____________________________________________   FINAL CLINICAL IMPRESSION(S) / ED DIAGNOSES  Final diagnoses:  Bacterial vaginosis      NEW MEDICATIONS STARTED DURING THIS VISIT:  New Prescriptions   CLINDAMYCIN (CLEOCIN) 300 MG CAPSULE    Take 1 capsule (300 mg total) by  mouth 3 (three) times daily.     Note:  This document was prepared using Dragon voice recognition software and may include unintentional dictation errors.    Faythe Ghee, PA-C 05/11/17 2337    Phineas Semen, MD 05/13/17 [phone]

## 2017-05-11 NOTE — ED Triage Notes (Signed)
Pt comes into the ED via POV c/o known STD exposure and she is now having vaginal itching again.  Patient has also exposed her significant other and has brought him for testing as well. Patient in NAD at this time with even and unlabored respirations and ambulatory to triage.

## 2017-05-11 NOTE — Discharge Instructions (Signed)
Follow up with the health department if the other tests return as positive.  You have bacterial vaginosis.  Take the medication as prescribed.  You should refrain from sex as her partner has been diagnosed with STD drink plenty of fluids to help flush the medications through system.  Return if you have any other problems

## 2017-05-12 LAB — CHLAMYDIA/NGC RT PCR (ARMC ONLY)
CHLAMYDIA TR: NOT DETECTED
N gonorrhoeae: NOT DETECTED

## 2017-06-07 ENCOUNTER — Encounter: Payer: Self-pay | Admitting: Maternal Newborn

## 2017-06-16 ENCOUNTER — Encounter: Payer: Self-pay | Admitting: Maternal Newborn

## 2017-06-16 ENCOUNTER — Ambulatory Visit (INDEPENDENT_AMBULATORY_CARE_PROVIDER_SITE_OTHER): Payer: Medicaid Other | Admitting: Maternal Newborn

## 2017-06-16 VITALS — BP 110/80 | Wt 209.0 lb

## 2017-06-16 DIAGNOSIS — Z3689 Encounter for other specified antenatal screening: Secondary | ICD-10-CM

## 2017-06-16 DIAGNOSIS — Z3482 Encounter for supervision of other normal pregnancy, second trimester: Secondary | ICD-10-CM

## 2017-06-16 DIAGNOSIS — O219 Vomiting of pregnancy, unspecified: Secondary | ICD-10-CM

## 2017-06-16 MED ORDER — ONDANSETRON 4 MG PO TBDP: 4.0000 mg | ORAL_TABLET | Freq: Four times a day (QID) | ORAL | 0 refills | Status: DC | PRN

## 2017-06-16 NOTE — Progress Notes (Signed)
C/O Can't keep any food down at all no matter what it is - is [place] but can't eat;

## 2017-06-17 LAB — HEPATITIS B SURFACE ANTIGEN: Hepatitis B Surface Ag: NEGATIVE

## 2017-06-17 LAB — VARICELLA ZOSTER ANTIBODY, IGG: VARICELLA: 2632 {index} (ref >165)

## 2017-06-17 LAB — RPR: RPR: NONREACTIVE

## 2017-06-17 LAB — HIV ANTIBODY (ROUTINE TESTING W REFLEX): HIV Screen 4th Generation wRfx: NONREACTIVE

## 2017-06-17 LAB — RUBELLA SCREEN: RUBELLA: 1.71 {index} (ref >0.99)

## 2017-06-17 LAB — GLUCOSE, 1 HOUR GESTATIONAL: Gestational Diabetes Screen: 79 mg/dL (ref 65–139)

## 2017-06-19 ENCOUNTER — Encounter: Payer: Self-pay | Admitting: Maternal Newborn

## 2017-06-19 DIAGNOSIS — O219 Vomiting of pregnancy, unspecified: Secondary | ICD-10-CM | POA: Insufficient documentation

## 2017-06-19 DIAGNOSIS — O0990 Supervision of high risk pregnancy, unspecified, unspecified trimester: Secondary | ICD-10-CM | POA: Insufficient documentation

## 2017-06-19 NOTE — Progress Notes (Signed)
06/16/2017   Chief Complaint: Amenorrhea, positive home pregnancy test, desires prenatal care.  Transfer of Care Patient: yes, has received some care at free prenatal clinic  History of Present Illness: Sharon Dean is a 22 y.o. G3P2002 at [redacted]w[redacted]d based on Ultrasound.  Patient's last menstrual period was 02/03/2017 (exact date). Her Estimated Date of Delivery is 11/02/17 (ultrasound dating). She has the above CC.   Her periods were: irregular periods  She was using no method when she conceived.  She has Positive signs or symptoms of nausea/vomiting of pregnancy. Has been taking Unisom and B6 without relief. Rx sent for Zofran after discussion of risks/benefits. She has Negative signs or symptoms of miscarriage or preterm labor She identifies Negative Zika risk factors for her and her partner On any different medications around the time she conceived/early pregnancy: No, she is considering establishing care with psychiatry for bipolar disorder, discussed having her share which medications she is prescribed with OB providers to ensure safety in pregnancy. History of varicella: No   ROS: A 12-point review of systems was performed and negative, except as stated in the above HPI.  OBGYN History: As per HPI. OB History  Gravida Para Term Preterm AB Living  3 2 2     2   SAB TAB Ectopic Multiple Live Births        0 2    # Outcome Date GA Lbr Len/2nd Weight Sex Delivery Anes PTL Lv  3 Current           2 Term 01/19/16 [redacted]w[redacted]d  7 lb 1.2 oz (3.21 kg) M Vag-Spont None  LIV  1 Term 12/30/11    M Vag-Spont  N LIV    Any issues with any prior pregnancies: no Any prior children are healthy, doing well, without any problems or issues: yes History of pap smears: Yes. Last pap smear: believes that she got it at clinic with early prenatal care (2019), will request records. History of STIs: Yes, trichomoniasis in 04/2017   Past Medical History: Past Medical History:  Diagnosis Date  . Bipolar 1 disorder  (HCC)     Past Surgical History: History reviewed. No pertinent surgical history.  Family History:  History reviewed. No pertinent family history. She denies any female cancers, bleeding or blood clotting disorders.  She denies any history of intellectual disability, birth defects or genetic disorders in her or the FOB's history  Social History:  Social History   Socioeconomic History  . Marital status: Single    Spouse name: Not on file  . Number of children: 2  . Years of education: 43  . Highest education level: Not on file  Occupational History  . Occupation: unemployed  Social Needs  . Financial resource strain: Not on file  . Food insecurity:    Worry: Not on file    Inability: Not on file  . Transportation needs:    Medical: Not on file    Non-medical: Not on file  Tobacco Use  . Smoking status: Never Smoker  . Smokeless tobacco: Never Used  Substance and Sexual Activity  . Alcohol use: No    Frequency: Never  . Drug use: Never  . Sexual activity: Yes    Birth control/protection: None  Lifestyle  . Physical activity:    Days per week: Not on file    Minutes per session: Not on file  . Stress: Not on file  Relationships  . Social connections:    Talks on phone: Not on file  Gets together: Not on file    Attends religious service: Not on file    Active member of club or organization: Not on file    Attends meetings of clubs or organizations: Not on file    Relationship status: Not on file  . Intimate partner violence:    Fear of current or ex partner: Not on file    Emotionally abused: Not on file    Physically abused: Not on file    Forced sexual activity: Not on file  Other Topics Concern  . Not on file  Social History Narrative  . Not on file   Any cats in the household: no  Allergy: Allergies  Allergen Reactions  . Penicillins Anaphylaxis, Other (See Comments) and Swelling    Throat Childhood allergy, mother told her she was allergic.       Current Outpatient Medications:  Current Outpatient Medications:  .  Prenatal Vit-Fe Fumarate-FA (PRENATAL MULTIVITAMIN) TABS tablet, Take 1 tablet by mouth daily at 12 noon., Disp: 30 tablet, Rfl: 11 .  ondansetron (ZOFRAN ODT) 4 MG disintegrating tablet, Take 1 tablet (4 mg total) by mouth every 6 (six) hours as needed for nausea., Disp: 20 tablet, Rfl: 0   Physical Exam:   BP 110/80   Wt 209 lb (94.8 kg)   LMP 02/03/2017 (Exact Date)   BMI 34.78 kg/m  Body mass index is 34.78 kg/m. Constitutional: Well nourished, well developed female in no acute distress.  Neck:  Supple, normal appearance, and no thyromegaly  Cardiovascular: regular rate  Respiratory:  Normal respiratory effort Abdomen: gravid,  no masses, hernias; diffusely non tender to palpation, non distended, FHT heard with Doppler. Breasts: patient declined Neuro/Psych:  Normal mood and affect.  Skin:  Warm and dry.    Pelvic exam: patient declined, has had exam in early pregnancy. Discussed need to have record of Pap smear.  Assessment: [name] is a 22 y.o. G3P2002 at 451w4d based on  Ultrasound with an Estimated Date of Delivery: 11/02/17, presenting for prenatal care.  Plan:  1) Avoid alcoholic beverages. 2) Patient encouraged not to smoke.  3) Discontinue the use of all non-medicinal drugs and chemicals.  4) Take prenatal vitamins daily.  5) Seatbelt use advised 6) Nutrition, food safety (fish, cheese advisories, and high nitrite foods) and exercise discussed. 7) [facility]  8) Patient is asked about travel to areas at risk for the Zika virus, and counseled to avoid travel and exposure to mosquitoes or sexual partners who may have themselves been exposed to the virus. Testing is discussed, and will be ordered as appropriate.  9) Genetic Screening, such as with 1st Trimester Screening, cell free fetal DNA, AFP testing, and Ultrasound, as well as with amniocentesis  and CVS as appropriate, is discussed with patient. She plans to decline genetic testing this pregnancy. 10) GTT today for BMI> 30 with titers and HIV/RPR/Hep B. CBC and type and screen done in early pregnancy (04/23/2017) along with GC. Urine culture and drug screen today. 11) Ultrasound at 4085w3d established viability. Dating is based on this scan. 12) FHT heard today 130s-140s.  Problem list reviewed and updated.  Return on 06/22/2017 for anatomy scan and ROB.  Marcelyn BruinsJacelyn Schmid, CNM [facility], [facility] 06/19/2017  8:52 AM

## 2017-06-21 LAB — URINE CULTURE

## 2017-06-21 LAB — URINE DRUG PANEL 7

## 2017-06-22 ENCOUNTER — Encounter: Payer: Medicaid Other | Admitting: Obstetrics and Gynecology

## 2017-06-22 ENCOUNTER — Other Ambulatory Visit: Payer: Medicaid Other

## 2017-06-22 ENCOUNTER — Ambulatory Visit: Payer: Medicaid Other

## 2017-06-22 ENCOUNTER — Encounter: Payer: Medicaid Other | Admitting: Certified Nurse Midwife

## 2017-06-22 NOTE — Progress Notes (Deleted)
    Routine Prenatal Care Visit  Subjective  Sharon Dean is a 22 y.o. G3P2002 at 4468w0d being seen today for ongoing prenatal care.  She is currently monitored for the following issues for this {Blank single:19197::"high-risk","low-risk"} pregnancy and has Bipolar disorder without psychotic features (HCC); Supervision of normal pregnancy; and Nausea and vomiting of pregnancy, antepartum on their problem list.  ----------------------------------------------------------------------------------- Patient reports {sx:14538}.    .  .   . Denies leaking of fluid.  ----------------------------------------------------------------------------------- The following portions of the patient's history were reviewed and updated as appropriate: allergies, current medications, past family history, past medical history, past social history, past surgical history and problem list. Problem list updated.   Objective  Last menstrual period 02/03/2017, unknown if currently breastfeeding. Pregravid weight 210 lb (95.3 kg) Total Weight Gain -1 lb (-0.454 kg) Urinalysis:      Fetal Status:           General:  Alert, oriented and cooperative. Patient is in no acute distress.  Skin: Skin is warm and dry. No rash noted.   Cardiovascular: Normal heart rate noted  Respiratory: Normal respiratory effort, no problems with respiration noted  Abdomen: Soft, gravid, appropriate for gestational age.       Pelvic:  {Blank single:19197::"Cervical exam performed","Cervical exam deferred"}        Extremities: Normal range of motion.     ental Status: Normal mood and affect. Normal behavior. Normal judgment and thought content.     Assessment   22 y.o. [id number] at 1168w0d by  11/02/2017, by Ultrasound presenting for {Blank single:19197::"routine","work-in"} prenatal visit  Plan   THIRD Problems (from 06/16/17 to present)    Problem Noted Resolved   Supervision of normal pregnancy 06/19/2017 by Oswaldo ConroySchmid, Jacelyn Y, CNM No   Overview Addendum 06/22/2017  7:54 AM by Natale MilchSchuman, Chesley Veasey R, MD    Clinic [facility] Prenatal [facility]  Dating 12w ultrasound Blood type: --/--/O POS  Genetic Screen DECLINES Antibody:   Anatomic US  Rubella: 1.71 (04/05 1635) Varicella:  Immune  GTT Early:               Third trimester:  RPR: Non Reactive (04/05 1635)   Rhogam  HBsAg: Negative (04/05 1635)   TDaP vaccine                        Flu Shot: HIV: Non Reactive (04/05 1635)   Baby Food                                GBS:   Contraception  Pap: Need records, patient requested  CBB     CS/VBAC    Support Person                  Gestational age appropriate obstetric precautions including but not limited to vaginal bleeding, contractions, leaking of fluid and fetal movement were reviewed in detail with the patient.    Flu Shot: Anatomy Scan today:  *** Given information on prenatal classes with [facility].  Given information on birthcontrol options postpartum. Recommended [email or web address].  Given information on cord blood banking. Given information on volunteer doula program at the hospital.  Discussed breast feeding. Patient is planning on *** feeding. Patient was given resources and lactation consultation was advised.    No follow-ups on file.   Adelene Idlerhristanna Sinclair Alligood MD [facility], [facility] 06/22/2017, 7:53 AM

## 2017-06-23 ENCOUNTER — Ambulatory Visit (INDEPENDENT_AMBULATORY_CARE_PROVIDER_SITE_OTHER): Payer: Medicaid Other | Admitting: Maternal Newborn

## 2017-06-23 ENCOUNTER — Ambulatory Visit (INDEPENDENT_AMBULATORY_CARE_PROVIDER_SITE_OTHER): Payer: Medicaid Other

## 2017-06-23 ENCOUNTER — Encounter: Payer: Self-pay | Admitting: Maternal Newborn

## 2017-06-23 VITALS — BP 128/80 | Wt 210.0 lb

## 2017-06-23 DIAGNOSIS — Z3482 Encounter for supervision of other normal pregnancy, second trimester: Secondary | ICD-10-CM

## 2017-06-23 DIAGNOSIS — Z3689 Encounter for other specified antenatal screening: Secondary | ICD-10-CM

## 2017-06-23 DIAGNOSIS — Z3A21 21 weeks gestation of pregnancy: Secondary | ICD-10-CM

## 2017-06-23 NOTE — Patient Instructions (Signed)

## 2017-06-23 NOTE — Progress Notes (Signed)
No [email or web address] 

## 2017-06-23 NOTE — Progress Notes (Signed)
    Routine Prenatal Care Visit  Subjective  Sharon Dean is a 22 y.o. G3P2002 at 9826w1d being seen today for ongoing prenatal care.  She is currently monitored for the following issues for this low-risk pregnancy and has Bipolar disorder without psychotic features (HCC); Supervision of normal pregnancy; and Nausea and vomiting of pregnancy, antepartum on their problem list.  ----------------------------------------------------------------------------------- Patient reports no complaints.   Contractions: Not present. Vag. Bleeding: None.  Movement: Present. Denies leaking of fluid.  ----------------------------------------------------------------------------------- The following portions of the patient's history were reviewed and updated as appropriate: allergies, current medications, past family history, past medical history, past social history, past surgical history and problem list. Problem list updated.   Objective  Last menstrual period 02/03/2017, unknown if currently breastfeeding. Pregravid weight 210 lb (95.3 kg) Total Weight Gain 0 lb (0 kg) Urinalysis: No sample given Fetal Status: Fetal Heart Rate (bpm): 132   Movement: Present  Presentation: Vertex  General:  Alert, oriented and cooperative. Patient is in no acute distress.  Skin: Skin is warm and dry. No rash noted.   Cardiovascular: Normal heart rate noted  Respiratory: Normal respiratory effort, no problems with respiration noted  Abdomen: Soft, gravid, appropriate for gestational age. Pain/Pressure: Absent     Pelvic:  Cervical exam deferred        Extremities: Normal range of motion.  Edema: None  Mental Status: Normal mood and affect. Normal behavior. Normal judgment and thought content.     Assessment   21 y.o. [id number] at 126w1d, EDD 11/02/2017 by Ultrasound presenting for routine prenatal visit.  Plan   THIRD Problems (from 06/16/17 to present)    Problem Noted Resolved   Supervision of normal pregnancy  06/19/2017 by Oswaldo ConroySchmid, Iara Monds Y, CNM No   Overview Addendum 06/22/2017  7:54 AM by Natale MilchSchuman, Christanna R, MD    [facility]inic [facility] Prenatal Labs  Dating 12w ultrasound Blood type: --/--/O POS  Genetic Screen DECLINES Antibody:   Anatomic US  Rubella: 1.71 (04/05 1635) Varicella:  Immune  GTT Early:               Third trimester:  RPR: Non Reactive (04/05 1635)   Rhogam  HBsAg: Negative (04/05 1635)   TDaP vaccine                        Flu Shot: HIV: Non Reactive (04/05 1635)   Baby Food                                GBS:   Contraception  Pap: Need records, patient requested  CBB     CS/VBAC    Support Person                 Anatomy scan today incomplete for cardiac and profile. It's a boy! Follow-up anatomy scan next visit. Not able to obtain records for Pap done at outside clinic. Declines Pap today. Needs postpartum. Gestational age appropriate obstetric precautions were reviewed in detail with the patient. Please refer to After Visit Summary for other counseling recommendations.   Return in about 1 month (around 07/21/2017) for ROB and anatomy ultrasound f/u.  Marcelyn BruinsJacelyn Joliene Salvador, CNM 06/23/2017  3:54 PM

## 2017-07-21 ENCOUNTER — Other Ambulatory Visit: Payer: Medicaid Other

## 2017-07-21 ENCOUNTER — Encounter: Payer: Medicaid Other | Admitting: Maternal Newborn

## 2017-07-27 ENCOUNTER — Emergency Department
Admission: EM | Admit: 2017-07-27 | Discharge: 2017-07-27 | Disposition: A | Discharge status: Home / Self Care | Payer: Medicaid Other | Attending: Emergency Medicine | Admitting: Emergency Medicine

## 2017-07-27 ENCOUNTER — Emergency Department: Payer: Medicaid Other

## 2017-07-27 ENCOUNTER — Encounter: Payer: Self-pay | Admitting: Emergency Medicine

## 2017-07-27 DIAGNOSIS — R079 Chest pain, unspecified: Secondary | ICD-10-CM

## 2017-07-27 DIAGNOSIS — Z3492 Encounter for supervision of normal pregnancy, unspecified, second trimester: Secondary | ICD-10-CM | POA: Insufficient documentation

## 2017-07-27 DIAGNOSIS — O9989 Other specified diseases and conditions complicating pregnancy, childbirth and the puerperium: Secondary | ICD-10-CM | POA: Diagnosis not present

## 2017-07-27 DIAGNOSIS — Z3A24 24 weeks gestation of pregnancy: Secondary | ICD-10-CM | POA: Diagnosis not present

## 2017-07-27 LAB — COMPREHENSIVE METABOLIC PANEL
ALT: 11 U/L — AB (ref 14–54)
AST: 22 U/L (ref 15–41)
Albumin: 2.9 g/dL — ABNORMAL LOW (ref 3.5–5.0)
Alkaline Phosphatase: 73 U/L (ref 38–126)
Anion gap: 9 (ref 5–15)
BILIRUBIN TOTAL: 0.6 mg/dL (ref 0.3–1.2)
CO2: 18 mmol/L — ABNORMAL LOW (ref 22–32)
CREATININE: 0.57 mg/dL (ref 0.44–1.00)
Calcium: 8.5 mg/dL — ABNORMAL LOW (ref 8.9–10.3)
Chloride: 109 mmol/L (ref 101–111)
GFR calc Af Amer: >60 mL/min (ref >60)
Glucose, Bld: 100 mg/dL — ABNORMAL HIGH (ref 65–99)
Potassium: 2.8 mmol/L — ABNORMAL LOW (ref 3.5–5.1)
Sodium: 136 mmol/L (ref 135–145)
TOTAL PROTEIN: 7.3 g/dL (ref 6.5–8.1)

## 2017-07-27 LAB — CBC
HCT: 27.6 % — ABNORMAL LOW (ref 35.0–47.0)
Hemoglobin: 9.1 g/dL — ABNORMAL LOW (ref 12.0–16.0)
MCH: 25.4 pg — AB (ref 26.0–34.0)
MCHC: 33.2 g/dL (ref 32.0–36.0)
MCV: 76.6 fL — AB (ref 80.0–100.0)
PLATELETS: 203 10*3/uL (ref 150–440)
RBC: 3.6 MIL/uL — ABNORMAL LOW (ref 3.80–5.20)
RDW: 15.2 % — ABNORMAL HIGH (ref 11.5–14.5)
WBC: 10.4 10*3/uL (ref 3.6–11.0)

## 2017-07-27 LAB — TROPONIN I: Troponin I: <0.03 ng/mL (ref <0.03)

## 2017-07-27 MED ORDER — POTASSIUM CHLORIDE CRYS ER 20 MEQ PO TBCR
40.0000 meq | EXTENDED_RELEASE_TABLET | Freq: Once | ORAL | Status: AC
  Administered 2017-07-27: 40 meq via ORAL
  Filled 2017-07-27: qty 2

## 2017-07-27 MED ORDER — ONDANSETRON 4 MG PO TBDP: 4.0000 mg | ORAL_TABLET | Freq: Three times a day (TID) | ORAL | 1 refills | Status: DC | PRN

## 2017-07-27 NOTE — ED Triage Notes (Signed)
Pt arrived with complaints of chest discomfort/cramping that started today at 6am. Pt currently [redacted] weeks pregnant.

## 2017-07-27 NOTE — ED Provider Notes (Signed)
[facility] Emergency Department Provider Note       Time seen: ----------------------------------------- 8:30 AM on 07/27/2017 -----------------------------------------   I have reviewed the triage vital signs and the nursing notes.  HISTORY   Chief Complaint Chest Pain    HPI Sharon Dean is a 22 y.o. female with a history of bipolar disorder who presents to the ED for chest discomfort and cramping that started today around 6 AM.  She is currently [redacted] weeks pregnant, has not had any vaginal bleeding or leakage of fluid or abdominal pain.  Patient reports the child is moving normally.  She denies any recent illness or other complaints.  She states she has a history of GERD and anxiety but it feels like neither.  Past Medical History:  Diagnosis Date  . Bipolar 1 disorder [facility])     Patient Active Problem List   Diagnosis Date Noted  . Supervision of normal pregnancy 06/19/2017  . Nausea and vomiting of pregnancy, antepartum 06/19/2017  . Bipolar disorder without psychotic features (HCC) 12/05/2014    History reviewed. No pertinent surgical history.  Allergies Penicillins  Social History Social History   Tobacco Use  . Smoking status: Never Smoker  . Smokeless tobacco: Never Used  Substance Use Topics  . Alcohol use: No    Frequency: Never  . Drug use: Never   Review of Systems Constitutional: Negative for fever. Cardiovascular: Positive for chest pain Respiratory: Negative for shortness of breath. Gastrointestinal: Negative for abdominal pain, vomiting and diarrhea. Genitourinary: Negative for vaginal bleeding or leakage of fluid Musculoskeletal: Negative for back pain. Skin: Negative for rash. Neurological: Negative for headaches, focal weakness or numbness.  All systems negative/normal/unremarkable except as stated in the HPI  ____________________________________________   PHYSICAL EXAM:  VITAL SIGNS: ED Triage Vitals  Enc  Vitals Group     BP 07/27/17 0816 91/63     Pulse Rate 07/27/17 0816 90     Resp 07/27/17 0816 13     Temp 07/27/17 0816 98.3 F (36.8 C)     Temp Source 07/27/17 0816 Oral     SpO2 07/27/17 0816 100 %     Weight 07/27/17 0817 211 lb (95.7 kg)     Height 07/27/17 0817  (1.702 m)     Head Circumference --      Peak Flow --      Pain Score 07/27/17 0817 8     Pain Loc --      Pain Edu? --      Excl. in GC? --    Constitutional: Alert and oriented. Well appearing and in no distress. Eyes: Large right medial subconjunctival hemorrhage, normal extraocular movement, pupils equal round and reactive to light ENT   Head: Normocephalic and atraumatic.   Nose: No congestion/rhinnorhea.   Mouth/Throat: Mucous membranes are moist.   Neck: No stridor. Cardiovascular: Normal rate, regular rhythm. No murmurs, rubs, or gallops. Respiratory: Normal respiratory effort without tachypnea nor retractions. Breath sounds are clear and equal bilaterally. No wheezes/rales/rhonchi. Gastrointestinal: Gravid uterus, nontender. Musculoskeletal: Nontender with normal range of motion in extremities. No lower extremity tenderness nor edema. Neurologic:  Normal speech and language. No gross focal neurologic deficits are appreciated.  Skin:  Skin is warm, dry and intact. No rash noted. Psychiatric: Mood and affect are normal. Speech and behavior are normal.  ____________________________________________  EKG: Interpreted by me.  Sinus rhythm rate 89 bpm, possible LVH, prolonged QT, normal axis  ____________________________________________  ED COURSE:  As part  of my medical decision making, I reviewed the following data within the electronic MEDICAL RECORD NUMBER History obtained from family if available, nursing notes, old chart and ekg, as well as notes from prior ED visits. Patient presented for chest pain, we will assess with labs and imaging as indicated at this time.    Procedures ____________________________________________   LABS (pertinent positives/negatives)  Labs Reviewed  CBC - Abnormal; Notable for the following components:      Result Value   RBC 3.60 (*)    Hemoglobin 9.1 (*)    HCT 27.6 (*)    MCV 76.6 (*)    MCH 25.4 (*)    RDW 15.2 (*)    All other components within normal limits  COMPREHENSIVE METABOLIC PANEL - Abnormal; Notable for the following components:   Potassium 2.8 (*)    CO2 18 (*)    Glucose, Bld 100 (*)    BUN <5 (*)    Calcium 8.5 (*)    Albumin 2.9 (*)    ALT 11 (*)    All other components within normal limits  TROPONIN I    RADIOLOGY Images were viewed by me  Chest x-ray Is unremarkable ____________________________________________  DIFFERENTIAL DIAGNOSIS   Musculoskeletal pain, GERD, anxiety, pregnancy-induced cardiomyopathy, angina unlikely  FINAL ASSESSMENT AND PLAN  Chest pain   Plan: The patient had presented for chest pain of uncertain etiology. Patient's labs do reveal likely stable iron deficiency anemia. Patient's imaging is unremarkable.  She is cleared for outpatient follow-up.   Ulice Dash, MD   Note: This note was generated in part or whole with voice recognition software. Voice recognition is usually quite accurate but there are transcription errors that can and very often do occur. I apologize for any typographical errors that were not detected and corrected.     Emily Filbert, MD 07/31/17 [phone]

## 2017-09-13 ENCOUNTER — Encounter: Payer: Medicaid Other | Admitting: Obstetrics and Gynecology

## 2017-09-20 ENCOUNTER — Ambulatory Visit (INDEPENDENT_AMBULATORY_CARE_PROVIDER_SITE_OTHER): Payer: Medicaid Other

## 2017-09-20 ENCOUNTER — Encounter: Payer: Medicaid Other | Admitting: Advanced Practice Midwife

## 2017-09-20 ENCOUNTER — Encounter: Payer: Self-pay | Admitting: Advanced Practice Midwife

## 2017-09-20 ENCOUNTER — Ambulatory Visit (INDEPENDENT_AMBULATORY_CARE_PROVIDER_SITE_OTHER): Payer: Medicaid Other | Admitting: Advanced Practice Midwife

## 2017-09-20 VITALS — BP 112/72 | Wt 204.0 lb

## 2017-09-20 DIAGNOSIS — Z3A33 33 weeks gestation of pregnancy: Secondary | ICD-10-CM

## 2017-09-20 DIAGNOSIS — Z3689 Encounter for other specified antenatal screening: Secondary | ICD-10-CM | POA: Diagnosis not present

## 2017-09-20 DIAGNOSIS — Z3483 Encounter for supervision of other normal pregnancy, third trimester: Secondary | ICD-10-CM

## 2017-09-20 NOTE — Patient Instructions (Signed)
Third Trimester of Pregnancy The third trimester is from week 28 through week 40 (months 7 through 9). The third trimester is a time when the unborn baby (fetus) is growing rapidly. At the end of the ninth month, the fetus is about 20 inches in length and weighs 6-10 pounds. Body changes during your third trimester Your body will continue to go through many changes during pregnancy. The changes vary from woman to woman. During the third trimester:  Your weight will continue to increase. You can expect to gain 25-35 pounds (11-16 kg) by the end of the pregnancy.  You may begin to get stretch marks on your hips, abdomen, and breasts.  You may urinate more often because the fetus is moving lower into your pelvis and pressing on your bladder.  You may develop or continue to have heartburn. This is caused by increased hormones that slow down muscles in the digestive tract.  You may develop or continue to have constipation because increased hormones slow digestion and cause the muscles that push waste through your intestines to relax.  You may develop hemorrhoids. These are swollen veins (varicose veins) in the rectum that can itch or be painful.  You may develop swollen, bulging veins (varicose veins) in your legs.  You may have increased body aches in the pelvis, back, or thighs. This is due to weight gain and increased hormones that are relaxing your joints.  You may have changes in your hair. These can include thickening of your hair, rapid growth, and changes in texture. Some women also have hair loss during or after pregnancy, or hair that feels dry or thin. Your hair will most likely return to normal after your baby is born.  Your breasts will continue to grow and they will continue to become tender. A yellow fluid (colostrum) may leak from your breasts. This is the first milk you are producing for your baby.  Your belly button may stick out.  You may notice more swelling in your hands,  face, or ankles.  You may have increased tingling or numbness in your hands, arms, and legs. The skin on your belly may also feel numb.  You may feel short of breath because of your expanding uterus.  You may have more problems sleeping. This can be caused by the size of your belly, increased need to urinate, and an increase in your body's metabolism.  You may notice the fetus "dropping," or moving lower in your abdomen (lightening).  You may have increased vaginal discharge.  You may notice your joints feel loose and you may have pain around your pelvic bone.  What to expect at prenatal visits You will have prenatal exams every 2 weeks until week 36. Then you will have weekly prenatal exams. During a routine prenatal visit:  You will be weighed to make sure you and the baby are growing normally.  Your blood pressure will be taken.  Your abdomen will be measured to track your baby's growth.  The fetal heartbeat will be listened to.  Any test results from the previous visit will be discussed.  You may have a cervical check near your due date to see if your cervix has softened or thinned (effaced).  You will be tested for Group B streptococcus. This happens between 35 and 37 weeks.  Your health care provider may ask you:  What your birth plan is.  How you are feeling.  If you are feeling the baby move.  If you have had   any abnormal symptoms, such as leaking fluid, bleeding, severe headaches, or abdominal cramping.  If you are using any tobacco products, including cigarettes, chewing tobacco, and electronic cigarettes.  If you have any questions.  Other tests or screenings that may be performed during your third trimester include:  Blood tests that check for low iron levels (anemia).  Fetal testing to check the health, activity level, and growth of the fetus. Testing is done if you have certain medical conditions or if there are problems during the  pregnancy.  Nonstress test (NST). This test checks the health of your baby to make sure there are no signs of problems, such as the baby not getting enough oxygen. During this test, a belt is placed around your belly. The baby is made to move, and its heart rate is monitored during movement.  What is false labor? False labor is a condition in which you feel small, irregular tightenings of the muscles in the womb (contractions) that usually go away with rest, changing position, or drinking water. These are called Braxton Hicks contractions. Contractions may last for hours, days, or even weeks before true labor sets in. If contractions come at regular intervals, become more frequent, increase in intensity, or become painful, you should see your health care provider. What are the signs of labor?  Abdominal cramps.  Regular contractions that start at 10 minutes apart and become stronger and more frequent with time.  Contractions that start on the top of the uterus and spread down to the lower abdomen and back.  Increased pelvic pressure and dull back pain.  A watery or bloody mucus discharge that comes from the vagina.  Leaking of amniotic fluid. This is also known as your "water breaking." It could be a slow trickle or a gush. Let your health care provider know if it has a color or strange odor. If you have any of these signs, call your health care provider right away, even if it is before your due date. Follow these instructions at home: Medicines  Follow your health care provider's instructions regarding medicine use. Specific medicines may be either safe or unsafe to take during pregnancy.  Take a prenatal vitamin that contains at least 600 micrograms (mcg) of folic acid.  If you develop constipation, try taking a stool softener if your health care provider approves. Eating and drinking  Eat a balanced diet that includes fresh fruits and vegetables, whole grains, good sources of protein  such as meat, eggs, or tofu, and low-fat dairy. Your health care provider will help you determine the amount of weight gain that is right for you.  Avoid raw meat and uncooked cheese. These carry germs that can cause birth defects in the baby.  If you have low calcium intake from food, talk to your health care provider about whether you should take a daily calcium supplement.  Eat four or five small meals rather than three large meals a day.  Limit foods that are high in fat and processed sugars, such as fried and sweet foods.  To prevent constipation: ? Drink enough fluid to keep your urine clear or pale yellow. ? Eat foods that are high in fiber, such as fresh fruits and vegetables, whole grains, and beans. Activity  Exercise only as directed by your health care provider. Most women can continue their usual exercise routine during pregnancy. Try to exercise for 30 minutes at least 5 days a week. Stop exercising if you experience uterine contractions.  Avoid heavy   lifting.  Do not exercise in extreme heat or humidity, or at high altitudes.  Wear low-heel, comfortable shoes.  Practice good posture.  You may continue to have sex unless your health care provider tells you otherwise. Relieving pain and discomfort  Take frequent breaks and rest with your legs elevated if you have leg cramps or low back pain.  Take warm sitz baths to soothe any pain or discomfort caused by hemorrhoids. Use hemorrhoid cream if your health care provider approves.  Wear a good support bra to prevent discomfort from breast tenderness.  If you develop varicose veins: ? Wear support pantyhose or compression stockings as told by your healthcare provider. ? Elevate your feet for 15 minutes, 3-4 times a day. Prenatal care  Write down your questions. Take them to your prenatal visits.  Keep all your prenatal visits as told by your health care provider. This is important. Safety  Wear your seat belt at  all times when driving.  Make a list of emergency phone numbers, including numbers for family, friends, the hospital, and police and fire departments. General instructions  Avoid cat litter boxes and soil used by cats. These carry germs that can cause birth defects in the baby. If you have a cat, ask someone to clean the litter box for you.  Do not travel far distances unless it is absolutely necessary and only with the approval of your health care provider.  Do not use hot tubs, steam rooms, or saunas.  Do not drink alcohol.  Do not use any products that contain nicotine or tobacco, such as cigarettes and e-cigarettes. If you need help quitting, ask your health care provider.  Do not use any medicinal herbs or unprescribed drugs. These chemicals affect the formation and growth of the baby.  Do not douche or use tampons or scented sanitary pads.  Do not cross your legs for long periods of time.  To prepare for the arrival of your baby: ? Take prenatal classes to understand, practice, and ask questions about labor and delivery. ? Make a trial run to the hospital. ? Visit the hospital and tour the maternity area. ? Arrange for maternity or paternity leave through employers. ? Arrange for family and friends to take care of pets while you are in the hospital. ? Purchase a rear-facing car seat and make sure you know how to install it in your car. ? Pack your hospital bag. ? Prepare the baby's nursery. Make sure to remove all pillows and stuffed animals from the baby's crib to prevent suffocation.  Visit your dentist if you have not gone during your pregnancy. Use a soft toothbrush to brush your teeth and be gentle when you floss. Contact a health care provider if:  You are unsure if you are in labor or if your water has broken.  You become dizzy.  You have mild pelvic cramps, pelvic pressure, or nagging pain in your abdominal area.  You have lower back pain.  You have persistent  nausea, vomiting, or diarrhea.  You have an unusual or bad smelling vaginal discharge.  You have pain when you urinate. Get help right away if:  Your water breaks before 37 weeks.  You have regular contractions less than 5 minutes apart before 37 weeks.  You have a fever.  You are leaking fluid from your vagina.  You have spotting or bleeding from your vagina.  You have severe abdominal pain or cramping.  You have rapid weight loss or weight gain.    You have shortness of breath with chest pain.  You notice sudden or extreme swelling of your face, hands, ankles, feet, or legs.  Your baby makes fewer than 10 movements in 2 hours.  You have severe headaches that do not go away when you take medicine.  You have vision changes. Summary  The third trimester is from week 28 through week 40, months 7 through 9. The third trimester is a time when the unborn baby (fetus) is growing rapidly.  During the third trimester, your discomfort may increase as you and your baby continue to gain weight. You may have abdominal, leg, and back pain, sleeping problems, and an increased need to urinate.  During the third trimester your breasts will keep growing and they will continue to become tender. A yellow fluid (colostrum) may leak from your breasts. This is the first milk you are producing for your baby.  False labor is a condition in which you feel small, irregular tightenings of the muscles in the womb (contractions) that eventually go away. These are called Braxton Hicks contractions. Contractions may last for hours, days, or even weeks before true labor sets in.  Signs of labor can include: abdominal cramps; regular contractions that start at 10 minutes apart and become stronger and more frequent with time; watery or bloody mucus discharge that comes from the vagina; increased pelvic pressure and dull back pain; and leaking of amniotic fluid. This information is not intended to replace advice  given to you by your health care provider. Make sure you discuss any questions you have with your health care provider. Document Released: 02/22/2001 Document Revised: 08/06/2015 Document Reviewed: 05/01/2012 Elsevier Interactive Patient Education  2017 [employer]  

## 2017-09-20 NOTE — Progress Notes (Addendum)
  Routine Prenatal Care Visit  Subjective  Sharon Dean is a 22 y.o. G3P2002 at 2529w6d being seen today for ongoing prenatal care.  She is currently monitored for the following issues for this high-risk pregnancy and has Bipolar disorder without psychotic features (HCC); Supervision of normal pregnancy; Nausea and vomiting of pregnancy, antepartum; and Suicidal ideation on their problem list.  ----------------------------------------------------------------------------------- Patient reports fatigue, hips hurting, not sleeping well, heartburn. She has not been seen since April she says due to being a single mother and having transportation issues.   Contractions: Not present. Vag. Bleeding: None.  Movement: Present. Denies leaking of fluid.  ----------------------------------------------------------------------------------- The following portions of the patient's history were reviewed and updated as appropriate: allergies, current medications, past family history, past medical history, past social history, past surgical history and problem list. Problem list updated.   Objective  Blood pressure 112/72, weight 204 lb (92.5 kg), last menstrual period 02/03/2017, unknown if currently breastfeeding. Pregravid weight 210 lb (95.3 kg) Total Weight Gain -6 lb (-2.722 kg) Urinalysis: Urine Protein: 2+ Urine Glucose: Negative  Fetal Status: Fetal Heart Rate (bpm): 115 Fundal Height: 36 cm Movement: Present     Anatomy scan is complete today.  General:  Alert, oriented and cooperative. Patient is in no acute distress.  Skin: Skin is warm and dry. No rash noted.   Cardiovascular: Normal heart rate noted  Respiratory: Normal respiratory effort, no problems with respiration noted  Abdomen: Soft, gravid, appropriate for gestational age. Pain/Pressure: Present     Pelvic:  Cervical exam deferred        Extremities: Normal range of motion.  Edema: None  Mental Status: Normal mood and affect. Normal  behavior. Normal judgment and thought content.   Assessment   22 y.o. [id number] at 2329w6d by  11/02/2017, by Ultrasound presenting for routine prenatal visit  Plan   THIRD Problems (from 06/16/17 to present)    Problem Noted Resolved   Supervision of normal pregnancy 06/19/2017 by Oswaldo ConroySchmid, Jacelyn Y, CNM No   Overview Addendum 06/22/2017  7:54 AM by Natale MilchSchuman, Christanna R, MD    [facility] Prenatal [facility]  Dating 12w ultrasound Blood type: --/--/O POS  Genetic Screen DECLINES Antibody:   Anatomic US  Rubella: 1.71 (04/05 1635) Varicella:  Immune  GTT Early:               Third trimester:  RPR: Non Reactive (04/05 1635)   Rhogam  HBsAg: Negative (04/05 1635)   TDaP vaccine                        Flu Shot: HIV: Non Reactive (04/05 1635)   Baby Food                                GBS:   Contraception  Pap: Need records, patient requested  CBB     CS/VBAC    Support Person                  Preterm labor symptoms and general obstetric precautions including but not limited to vaginal bleeding, contractions, leaking of fluid and fetal movement were reviewed in detail with the patient. Please refer to After Visit Summary for other counseling recommendations.   Return in about 2 weeks (around 10/04/2017) for rob.  Tresea MallJane Jaylin Roundy, CNM 09/20/2017 4:23 PM

## 2017-09-20 NOTE — Progress Notes (Signed)
ROB Anatomy scan  Declined TDAP

## 2017-09-22 LAB — URINE DRUG PANEL 7
Amphetamines, Urine: NEGATIVE ng/mL
BARBITURATE QUANT UR: NEGATIVE ng/mL
Benzodiazepine Quant, Ur: NEGATIVE ng/mL
COCAINE (METAB.): NEGATIVE ng/mL
Cannabinoid Quant, Ur: NEGATIVE ng/mL
OPIATE QUANT UR: NEGATIVE ng/mL
PCP QUANT UR: NEGATIVE ng/mL

## 2017-09-22 LAB — URINE CULTURE

## 2017-09-30 ENCOUNTER — Observation Stay
Admission: EM | Admit: 2017-09-30 | Discharge: 2017-10-01 | Disposition: A | Discharge status: Home / Self Care | Payer: Medicaid Other | Attending: Certified Nurse Midwife | Admitting: Certified Nurse Midwife

## 2017-09-30 ENCOUNTER — Other Ambulatory Visit: Payer: Self-pay

## 2017-09-30 DIAGNOSIS — O99613 Diseases of the digestive system complicating pregnancy, third trimester: Secondary | ICD-10-CM | POA: Insufficient documentation

## 2017-09-30 DIAGNOSIS — O26893 Other specified pregnancy related conditions, third trimester: Secondary | ICD-10-CM | POA: Insufficient documentation

## 2017-09-30 DIAGNOSIS — O212 Late vomiting of pregnancy: Secondary | ICD-10-CM | POA: Diagnosis not present

## 2017-09-30 DIAGNOSIS — D508 Other iron deficiency anemias: Secondary | ICD-10-CM | POA: Insufficient documentation

## 2017-09-30 DIAGNOSIS — O0933 Supervision of pregnancy with insufficient antenatal care, third trimester: Secondary | ICD-10-CM | POA: Diagnosis not present

## 2017-09-30 DIAGNOSIS — K5900 Constipation, unspecified: Secondary | ICD-10-CM | POA: Diagnosis present

## 2017-09-30 DIAGNOSIS — O99343 Other mental disorders complicating pregnancy, third trimester: Secondary | ICD-10-CM | POA: Insufficient documentation

## 2017-09-30 DIAGNOSIS — O23593 Infection of other part of genital tract in pregnancy, third trimester: Secondary | ICD-10-CM | POA: Diagnosis not present

## 2017-09-30 DIAGNOSIS — E66811 Obesity, class 1: Secondary | ICD-10-CM | POA: Diagnosis present

## 2017-09-30 DIAGNOSIS — Z3A35 35 weeks gestation of pregnancy: Secondary | ICD-10-CM | POA: Diagnosis not present

## 2017-09-30 DIAGNOSIS — F319 Bipolar disorder, unspecified: Secondary | ICD-10-CM | POA: Insufficient documentation

## 2017-09-30 DIAGNOSIS — O26853 Spotting complicating pregnancy, third trimester: Secondary | ICD-10-CM | POA: Diagnosis present

## 2017-09-30 DIAGNOSIS — O99213 Obesity complicating pregnancy, third trimester: Secondary | ICD-10-CM | POA: Insufficient documentation

## 2017-09-30 DIAGNOSIS — D509 Iron deficiency anemia, unspecified: Secondary | ICD-10-CM

## 2017-09-30 DIAGNOSIS — R112 Nausea with vomiting, unspecified: Secondary | ICD-10-CM

## 2017-09-30 DIAGNOSIS — N760 Acute vaginitis: Secondary | ICD-10-CM | POA: Insufficient documentation

## 2017-09-30 DIAGNOSIS — E669 Obesity, unspecified: Secondary | ICD-10-CM | POA: Insufficient documentation

## 2017-09-30 DIAGNOSIS — E876 Hypokalemia: Secondary | ICD-10-CM | POA: Diagnosis present

## 2017-09-30 DIAGNOSIS — Z3483 Encounter for supervision of other normal pregnancy, third trimester: Secondary | ICD-10-CM

## 2017-09-30 DIAGNOSIS — Z880 Allergy status to penicillin: Secondary | ICD-10-CM | POA: Insufficient documentation

## 2017-09-30 DIAGNOSIS — O99013 Anemia complicating pregnancy, third trimester: Secondary | ICD-10-CM | POA: Insufficient documentation

## 2017-09-30 HISTORY — DX: Obesity, class 1: E66.811

## 2017-09-30 HISTORY — DX: Obesity, unspecified: E66.9

## 2017-09-30 LAB — CBC
HEMATOCRIT: 25.1 % — AB (ref 35.0–47.0)
HEMOGLOBIN: 7.9 g/dL — AB (ref 12.0–16.0)
MCH: 21.2 pg — ABNORMAL LOW (ref 26.0–34.0)
MCHC: 31.4 g/dL — ABNORMAL LOW (ref 32.0–36.0)
MCV: 67.6 fL — AB (ref 80.0–100.0)
Platelets: 212 10*3/uL (ref 150–440)
RBC: 3.71 MIL/uL — ABNORMAL LOW (ref 3.80–5.20)
RDW: 18 % — AB (ref 11.5–14.5)
WBC: 7.8 10*3/uL (ref 3.6–11.0)

## 2017-09-30 LAB — MAGNESIUM: Magnesium: 1.8 mg/dL (ref 1.7–2.4)

## 2017-09-30 LAB — COMPREHENSIVE METABOLIC PANEL
ALBUMIN: 2.6 g/dL — AB (ref 3.5–5.0)
ALT: 9 U/L (ref 0–44)
AST: 28 U/L (ref 15–41)
Alkaline Phosphatase: 124 U/L (ref 38–126)
Anion gap: 9 (ref 5–15)
CHLORIDE: 110 mmol/L (ref 98–111)
CO2: 20 mmol/L — ABNORMAL LOW (ref 22–32)
Calcium: 8.4 mg/dL — ABNORMAL LOW (ref 8.9–10.3)
Creatinine, Ser: 0.74 mg/dL (ref 0.44–1.00)
GFR calc Af Amer: >60 mL/min (ref >60)
GFR calc non Af Amer: >60 mL/min (ref >60)
GLUCOSE: 98 mg/dL (ref 70–99)
POTASSIUM: 2.8 mmol/L — AB (ref 3.5–5.1)
Sodium: 139 mmol/L (ref 135–145)
TOTAL PROTEIN: 6.6 g/dL (ref 6.5–8.1)
Total Bilirubin: 0.5 mg/dL (ref 0.3–1.2)

## 2017-09-30 LAB — URINALYSIS, COMPLETE (UACMP) WITH MICROSCOPIC
BILIRUBIN URINE: NEGATIVE
Bacteria, UA: NONE SEEN
GLUCOSE, UA: NEGATIVE mg/dL
HGB URINE DIPSTICK: NEGATIVE
KETONES UR: NEGATIVE mg/dL
LEUKOCYTES UA: NEGATIVE
NITRITE: NEGATIVE
PH: 7 (ref 5.0–8.0)
PROTEIN: NEGATIVE mg/dL
SQUAMOUS EPITHELIAL / LPF: NONE SEEN (ref 0–5)
Specific Gravity, Urine: 1.008 (ref 1.005–1.030)

## 2017-09-30 LAB — CHLAMYDIA/NGC RT PCR (ARMC ONLY)
Chlamydia Tr: NOT DETECTED
N gonorrhoeae: NOT DETECTED

## 2017-09-30 MED ORDER — PROMETHAZINE HCL 25 MG/ML IJ SOLN
12.5000 mg | Freq: Four times a day (QID) | INTRAMUSCULAR | Status: DC | PRN
  Administered 2017-09-30: 12.5 mg via INTRAVENOUS
  Filled 2017-09-30: qty 1

## 2017-09-30 MED ORDER — SODIUM CHLORIDE FLUSH 0.9 % IV SOLN
INTRAVENOUS | Status: AC
  Administered 2017-09-30: 22:00:00
  Filled 2017-09-30: qty 10

## 2017-09-30 MED ORDER — LACTATED RINGERS IV BOLUS
1000.0000 mL | Freq: Once | INTRAVENOUS | Status: AC
  Administered 2017-09-30: 1000 mL via INTRAVENOUS

## 2017-09-30 MED ORDER — FAMOTIDINE IN NACL 20-0.9 MG/50ML-% IV SOLN
20.0000 mg | Freq: Once | INTRAVENOUS | Status: AC
  Administered 2017-09-30: 20 mg via INTRAVENOUS
  Filled 2017-09-30: qty 50

## 2017-09-30 MED ORDER — POTASSIUM CHLORIDE 2 MEQ/ML IV SOLN
INTRAVENOUS | Status: DC
  Administered 2017-10-01: 01:00:00 via INTRAVENOUS
  Filled 2017-09-30 (×3): qty 1000

## 2017-09-30 MED ORDER — ACETAMINOPHEN 500 MG PO TABS
1000.0000 mg | ORAL_TABLET | Freq: Four times a day (QID) | ORAL | Status: DC | PRN
  Administered 2017-10-01: 1000 mg via ORAL
  Filled 2017-09-30: qty 2

## 2017-09-30 MED ORDER — FLUCONAZOLE 50 MG PO TABS
150.0000 mg | ORAL_TABLET | Freq: Once | ORAL | Status: AC
  Administered 2017-09-30: 150 mg via ORAL
  Filled 2017-09-30: qty 1

## 2017-09-30 NOTE — OB Triage Note (Signed)
Pt presents c/o brownish/reddish mucous when she wiped this morning. Pt states she has a headache that won't go away. States that she has been seeing spots. Pt is also c/o back pain that won't go away. Before patient got here, she said she hasn't felt baby move today but once I put the fetal monitors on her, she reports positive fetal movement. No recent intercourse. Pt doesn't have an appetite and reports being nauseous. Denies any LOF. Pt also states she has a bad sinus infection too. Vitals WNL. Will continue to monitor.

## 2017-09-30 NOTE — H&P (Signed)
OB History & Physical   History of Present Illness:  Chief Complaint:  22 year old G3 P2002 presents with multiple complaints HPI:  Sharon Dean is a 22 y.o. G45P2002 female with EDC=11/02/2017 at [redacted]w[redacted]d dated by a 12wk3d ultrasound.  Her pregnancy has been complicated by insufficient prenatal care (has been seen 3 times)and bipolar disorder, .  She presents to L&D for evaluation of multiple concerns: 1) brown discharge that she noticed this Am when wiping. Has had some vulvar irritation. Has occasional BH contractions. No leakage of water. 2) nausea and vomiting today. Vomited clear then bile emesis this AM, last time 1100. Has not been able to keep down food or fluids since then. Denies diarrhea. Has constipation "has not had a BM in months" 3) decreased fetal movement today 4) Headache x 3 days from frontal to occipital area. Rates headache pain 8/10. Has photophobia with the headache. Has not taken any medication for the headache. Does not take prenatal vitamins. Since [redacted] weeks gestation, has lost 6 #. 28 week labs have not been done.    Prenatal care site: Prenatal care at [facility] has also been remarkable for  [facility] P[facility]atal [facility]  Dating 12w ultrasound Blood type: --/--/O POS  Genetic Screen DECLINES Antibody:   Anatomic [place] normal Rubella: 1.71 (04/05 1635) Varicella:  Immune  GTT Early:  79 at 20 weeks             Third trimester:  RPR: Non Reactive (04/05 1635)   Rhogam  HBsAg: Negative (04/05 1635)   TDaP vaccine     declined                   Flu Shot: HIV: Non Reactive (04/05 1635)   Baby Food                                GBS:   Contraception  Pap: Need records, patient requested  CBB     CS/VBAC    Support Person             Maternal Medical History:   Past Medical History:  Diagnosis Date  . Bipolar 1 disorder (HCC)   . Obesity (BMI 30.0-34.9)     History reviewed. No pertinent surgical history.  Allergies  Allergen Reactions  . Penicillins  Anaphylaxis, Other (See Comments) and Swelling    Throat Childhood allergy, mother told her she was allergic.     Medications: none  Social History: She  reports that she has never smoked. She has never used smokeless tobacco. She reports that she does not drink alcohol or use drugs.  Family History: family history is not on file.   Review of Systems: Review of Systems  Constitutional: Positive for weight loss. Negative for chills and fever.  HENT: Positive for congestion and sore throat. Negative for ear pain.   Respiratory: Positive for cough.   Cardiovascular: Positive for chest pain.  Gastrointestinal: Positive for constipation, heartburn, nausea and vomiting. Negative for abdominal pain and diarrhea.  Musculoskeletal: Positive for back pain (lumbar sacral x months).  Skin: Positive for rash.  Neurological: Positive for headaches. Negative for focal weakness.  Endo/Heme/Allergies: Negative for environmental allergies.       Physical Exam:  Vital Signs: BP (!) 92/58 (BP Location: Left Arm)   Pulse 74   Temp 98 F (36.7 C) (Oral)   Resp 18   Ht 5\' 6"  (  1.676 m)   Wt 92.5 kg (204 lb)   LMP 02/03/2017 (Exact Date)   SpO2 100%   BMI 32.93 kg/m  General: BF in no acute distress, asked to have the lights turned off HEENT:PERRLA  Nose: boggy, swollen turbinates  Throat: enlarged tonsils-no inflammation  No nuchal rigidity  Heart: regular rate & rhythm.  No murmurs/rubs/gallops Lungs: clear to auscultation bilaterally Abdomen: soft, gravid, non-tender, BS active;  cephalic Pelvic:   External: Normal external female genitalia  Vagina: small amount white discharge, patulous introitus  Cervix: Dilation: Fingertip / Effacement : Thick / Station: -2   [place]: neg clue cells; neg trichomonas;  positive yeast   Extremities: non-tender, symmetric, no edema bilaterally.   Neurologic: Alert & oriented x 3.   Baseline FHR: 135 with accelerations to 150s to 160s, moderate  variability Toco: irregular contractions and irritability  Assessment:  Sharon Dean is a 22 y.o. 723P2002 female at 625w2d with nausea/vomiting/headache-R/O viral syndrome.  FWB: reactive strip Brown discharge may be due to monilial vulvovaginitis R/O STD  Plan:  1. Admit to Labor & Delivery for observation/ IV hydration/ IV antiemetics.   2. CBC, CMP, GBS, Aptima 3. Pepcid for heartburn.   4.   Diflucan/ Tylenol when tolerating po       Farrel ConnersColleen Icey Tello  09/30/2017 9:49 PM

## 2017-09-30 NOTE — Progress Notes (Signed)
Patient stated she "missed the cup" to catch a urine. RN offered patient water and patient stated she was unable to tolerate water and opted for ice chips instead. RN went in patient's room to adjust fetal monitor, and patient was sitting up eating subway, gummies and drinking sunkist. Pt handed it off to her husband when RN came in the room.

## 2017-10-01 DIAGNOSIS — O23593 Infection of other part of genital tract in pregnancy, third trimester: Secondary | ICD-10-CM | POA: Diagnosis not present

## 2017-10-01 DIAGNOSIS — O99613 Diseases of the digestive system complicating pregnancy, third trimester: Secondary | ICD-10-CM

## 2017-10-01 DIAGNOSIS — E876 Hypokalemia: Secondary | ICD-10-CM | POA: Diagnosis present

## 2017-10-01 DIAGNOSIS — R112 Nausea with vomiting, unspecified: Secondary | ICD-10-CM

## 2017-10-01 DIAGNOSIS — K5900 Constipation, unspecified: Secondary | ICD-10-CM | POA: Diagnosis present

## 2017-10-01 MED ORDER — FUSION 65-65-25-30 MG PO CAPS: 1.0000 | ORAL_CAPSULE | Freq: Every day | ORAL | 2 refills | Status: DC

## 2017-10-01 MED ORDER — FAMOTIDINE 20 MG PO TABS: 20.0000 mg | ORAL_TABLET | Freq: Two times a day (BID) | ORAL | 0 refills | Status: DC

## 2017-10-01 MED ORDER — FAMOTIDINE 20 MG PO TABS
20.0000 mg | ORAL_TABLET | Freq: Two times a day (BID) | ORAL | Status: DC
  Administered 2017-10-01: 20 mg via ORAL
  Filled 2017-10-01: qty 1

## 2017-10-01 MED ORDER — FERROUS FUMARATE 324 (106 FE) MG PO TABS
1.0000 | ORAL_TABLET | Freq: Every day | ORAL | Status: DC
  Administered 2017-10-01: 106 mg via ORAL
  Filled 2017-10-01 (×2): qty 1

## 2017-10-01 MED ORDER — MAGNESIUM SULFATE 2 GM/50ML IV SOLN
2.0000 g | Freq: Once | INTRAVENOUS | Status: AC
  Administered 2017-10-01: 2 g via INTRAVENOUS
  Filled 2017-10-01: qty 50

## 2017-10-01 MED ORDER — POTASSIUM CHLORIDE CRYS ER 10 MEQ PO TBCR
20.0000 meq | EXTENDED_RELEASE_TABLET | Freq: Two times a day (BID) | ORAL | Status: DC
  Administered 2017-10-01: 20 meq via ORAL
  Filled 2017-10-01 (×2): qty 2

## 2017-10-01 MED ORDER — POTASSIUM CHLORIDE CRYS ER 20 MEQ PO TBCR: 20.0000 meq | EXTENDED_RELEASE_TABLET | Freq: Two times a day (BID) | ORAL | 0 refills | Status: DC

## 2017-10-01 MED ORDER — POLYETHYLENE GLYCOL 3350 17 GM/SCOOP PO POWD: ORAL | 0 refills | Status: DC

## 2017-10-01 NOTE — Discharge Instructions (Signed)
TAke your potassium supplement twice a day for 3 days with food. Will recheck your levels when you return for follow up appointment [facility] Tuesday [facility] 3[facility]. Take Miralax daily until you have a BM then every other day. Can also take stool softeners like Colace Avoid spicy, greasy food to help with your nausea and heartburn Return to L&D for strong contractions every 5 minutes, leaking water from the vagina, bleeding like a period, and decreased fetal movement.

## 2017-10-01 NOTE — Final Progress Note (Signed)
Physician Final Progress Note  Patient ID: Sharon Dean MRN: [id number] DOB/AGE: [birth year] 22 y.o.  Admit date: 09/30/2017 Admitting provider: Homero Fellers, MD/ Jesus Genera. Danise Mina, [place] Discharge date: 10/01/2017   Admission Diagnoses: IUP at 35wk 2days with spotting Nausea and vomiting Constipation Headache Contractions  Discharge Diagnoses:  Active Problems:   Spotting affecting pregnancy in third trimester   Obesity (BMI 30.0-34.9)   Constipation in pregnancy in third trimester   Hypokalemia   Nausea and vomiting Monilial vulvovaginitis Microcystic hypochromic anemia Inadequate prenatal care  Consults: none  Significant Findings/ Diagnostic Studies: Sharon Dean is a 22 y.o. G42P2002 female with EDC=11/02/2017 at 40w2ddated by a 12wk3d ultrasound.  Her pregnancy has been complicated by insufficient prenatal care (has been seen 3 times)and bipolar disorder, .  She presented to L&D for evaluation of multiple concerns: 1) brown discharge that she noticed this Am when wiping. Has had some vulvar irritation. Has occasional BH contractions. No leakage of water. 2) nausea and vomiting today. Vomited clear then bile emesis this AM, last time 1100. Has not been able to keep down food or fluids since then. Denies diarrhea. Has constipation "has not had a BM in months" 3) decreased fetal movement today 4) Headache x 3 days from frontal to occipital area. Rates headache pain 8/10. Has photophobia with the headache. Has not taken any medication for the headache. Does not take prenatal vitamins. Since [redacted] weeks gestation, has lost 6 #. 28 week labs have not been done.  Significant labs revealed hypokalemia (K+ of 2.8), a magnesium of 1.8, hypochromic microcytic anemia (hemoglobin 7.9 gm/dl and hematocrit of 25.1%), and a normal urinalysis. Wet prep revealed hyphae. Results for orders placed or performed during the hospital encounter of 09/30/17 (from the past 48 hour(s))  Comprehensive  metabolic panel     Status: Abnormal   Collection Time: 09/30/17 10:18 PM  Result Value Ref Range   Sodium 139 135 - 145 mmol/L   Potassium 2.8 (L) 3.5 - 5.1 mmol/L   Chloride 110 98 - 111 mmol/L    Comment: Please note change in reference range.   CO2 20 (L) 22 - 32 mmol/L   Glucose, Bld 98 70 - 99 mg/dL    Comment: Please note change in reference range.   BUN <5 (L) 6 - 20 mg/dL    Comment: Please note change in reference range.   Creatinine, Ser 0.74 0.44 - 1.00 mg/dL   Calcium 8.4 (L) 8.9 - 10.3 mg/dL   Total Protein 6.6 6.5 - 8.1 g/dL   Albumin 2.6 (L) 3.5 - 5.0 g/dL   AST 28 15 - 41 U/L   ALT 9 0 - 44 U/L    Comment: Please note change in reference range.   Alkaline Phosphatase 124 38 - 126 U/L   Total Bilirubin 0.5 0.3 - 1.2 mg/dL   GFR calc non Af Amer >60 >60 mL/min   GFR calc Af Amer >60 >60 mL/min    Comment: (NOTE) The eGFR has been calculated using the CKD EPI equation. This calculation has not been validated in all clinical situations. eGFR's persistently <60 mL/min signify possible Chronic Kidney Disease.    Anion gap 9 5 - 15    Comment: [facility] [address] [place]  [id number] CBC     Status: Abnormal   Collection Time: 09/30/17 10:18 PM  Result Value Ref Range   WBC 7.8 3.6 - 11.0 K/uL   RBC 3.71 (L)  3.80 - 5.20 MIL/uL   Hemoglobin 7.9 (L) 12.0 - 16.0 g/dL   HCT 25.1 (L) 35.0 - 47.0 %   MCV 67.6 (L) 80.0 - 100.0 fL   MCH 21.2 (L) 26.0 - 34.0 pg   MCHC 31.4 (L) 32.0 - 36.0 g/dL   RDW 18.0 (H) 11.5 - 14.5 %   Platelets 212 150 - 440 K/uL    Comment: Performed at [facility], [address] [place], [place] [address]  [place] rt PCR [facility] only)     Status: None   Collection Time: 09/30/17 10:18 PM  Result Value Ref Range   Specimen source GC/Chlam ENDOCERVICAL    Chlamydia Tr NOT DETECTED NOT DETECTED   N gonorrhoeae NOT DETECTED NOT DETECTED    Comment: (NOTE) 100  This methodology has not  been evaluated in pregnant women or in 200  patients with a history of hysterectomy. 300 400  This methodology will not be performed on patients less than 51  years of age. Performed at [facility], [address] [place], [place] [address]   Magnesium     Status: None   Collection Time: 09/30/17 10:18 PM  Result Value Ref Range   Magnesium 1.8 1.7 - 2.4 mg/dL    Comment: Performed at [facility], [address] [place], [place] [address]  Urinalysis, Complete w Microscopic     Status: Abnormal   Collection Time: 09/30/17 11:30 PM  Result Value Ref Range   Color, Urine YELLOW (A) YELLOW   APPearance CLEAR (A) CLEAR   Specific Gravity, Urine 1.008 1.005 - 1.030   pH 7.0 5.0 - 8.0   Glucose, UA NEGATIVE NEGATIVE mg/dL   Hgb urine dipstick NEGATIVE NEGATIVE   Bilirubin Urine NEGATIVE NEGATIVE   Ketones, ur NEGATIVE NEGATIVE mg/dL   Protein, ur NEGATIVE NEGATIVE mg/dL   Nitrite NEGATIVE NEGATIVE   Leukocytes, UA NEGATIVE NEGATIVE   WBC, UA 0-5 0 - 5 WBC/hpf   Bacteria, UA NONE SEEN NONE SEEN   Squamous Epithelial / LPF NONE SEEN 0 - 5    Comment: Performed at [facility] [place], [address] [place], [place] [address]   Cathlean Cower received IV hydration overnight along with 30 meq KCL and 2 GM magnesium. Her headache resolved after IV hydration was started and she was given IV promethazine. She was able to sleep overnight. She tolerated fluids and a regular breakfast. FHR remained reactive. She had irregular, mild contractions, that would occasionally become regular and frequent, but cervix did not change. It remained FT/thick and -2 to -3. She had a monilial vulvovaginitis that was treated with Diflucan 150 mgm x 1 dose.    Procedures: none  Discharge Condition: stable  Disposition: Discharge disposition: 01-Home or Self Care       Diet: Regular diet: Petsch, avoid spicy and greasy food.   Discharge Activity: Activity as tolerated  Discharge  Instructions    Discharge patient   Complete by:  As directed    Discharge disposition:  01-Home or Self Care   Discharge patient date:  10/01/2017     Allergies as of 10/01/2017      Reactions   Penicillins Anaphylaxis, Other (See Comments), Swelling   Throat Childhood allergy, mother told her she was allergic.       Medication List    STOP taking these medications   prenatal multivitamin Tabs tablet     TAKE these medications   famotidine 20 MG tablet Commonly known as:  PEPCID Take 1 tablet (20 mg total) by mouth 2 (two) times daily.   FUSION 65-65-25-30 MG Caps Take 1 tablet by mouth daily.   polyethylene glycol powder powder Commonly known as:  MIRALAX Take 17 GM daily in juice or water   potassium chloride SA 20 MEQ tablet Commonly known as:  K-DUR,KLOR-CON Take 1 tablet (20 mEq total) by mouth 2 (two) times daily for 3 days.      Follow up on Tuesday 23 July at [facility] [place]. Plan on hematology consult  Total time spent taking care of this patient: 45 minutes minutes  Signed: Dalia Heading 10/01/2017, 9:56 AM

## 2017-10-02 ENCOUNTER — Other Ambulatory Visit: Payer: Self-pay | Admitting: Certified Nurse Midwife

## 2017-10-02 DIAGNOSIS — D509 Iron deficiency anemia, unspecified: Secondary | ICD-10-CM | POA: Insufficient documentation

## 2017-10-03 ENCOUNTER — Ambulatory Visit (INDEPENDENT_AMBULATORY_CARE_PROVIDER_SITE_OTHER): Payer: Medicaid Other | Admitting: Certified Nurse Midwife

## 2017-10-03 VITALS — BP 110/60 | Wt 208.0 lb

## 2017-10-03 DIAGNOSIS — Z3A35 35 weeks gestation of pregnancy: Secondary | ICD-10-CM

## 2017-10-03 DIAGNOSIS — D509 Iron deficiency anemia, unspecified: Secondary | ICD-10-CM

## 2017-10-03 DIAGNOSIS — O99283 Endocrine, nutritional and metabolic diseases complicating pregnancy, third trimester: Secondary | ICD-10-CM

## 2017-10-03 DIAGNOSIS — Z3482 Encounter for supervision of other normal pregnancy, second trimester: Secondary | ICD-10-CM

## 2017-10-03 DIAGNOSIS — O0930 Supervision of pregnancy with insufficient antenatal care, unspecified trimester: Secondary | ICD-10-CM | POA: Insufficient documentation

## 2017-10-03 DIAGNOSIS — O0990 Supervision of high risk pregnancy, unspecified, unspecified trimester: Secondary | ICD-10-CM

## 2017-10-03 DIAGNOSIS — R112 Nausea with vomiting, unspecified: Secondary | ICD-10-CM

## 2017-10-03 DIAGNOSIS — Z131 Encounter for screening for diabetes mellitus: Secondary | ICD-10-CM

## 2017-10-03 DIAGNOSIS — O212 Late vomiting of pregnancy: Secondary | ICD-10-CM

## 2017-10-03 DIAGNOSIS — O0933 Supervision of pregnancy with insufficient antenatal care, third trimester: Secondary | ICD-10-CM

## 2017-10-03 DIAGNOSIS — E876 Hypokalemia: Secondary | ICD-10-CM

## 2017-10-03 LAB — CULTURE, BETA STREP (GROUP B ONLY)

## 2017-10-03 NOTE — Progress Notes (Signed)
HROB and follow up from L&D visit 7/20-7/21:  Was not able to pick up the prescriptions for potassium, Miralax, Pepcid and Fusion Plus. Problems with not having Medicaid card when [facility]. She complains of right SI pain. Has not used Biofreeze. Was using either heat or ice. Baby active. Irregular contractions (cervix was FT/TH  in hospital) Was given IV and po potassium in hospital as K+ was 2.8. She had microcytic, hypochromic anemia with hemoglobin 7.9 gm/dl. Given samples of Fusion Plus and discussed foods high in K+ and foods high in iron. Consulting hematology for anemia. [facility] July. Anemia panel today, hemoglobin A1C, antibody screen, BMP today FHT WNL. FH 38 cm ROB and growth scan in 1 week Nexplanon for BC pp  Sharon Dean, CNM

## 2017-10-03 NOTE — Progress Notes (Signed)
C/o really bad back pain, ?sciatic, really hurts to walk, sit, stand or lie down. Lying down makes it worse. Tightness mid upper abd.rj

## 2017-10-04 ENCOUNTER — Encounter: Payer: Medicaid Other | Admitting: Advanced Practice Midwife

## 2017-10-04 ENCOUNTER — Encounter (INDEPENDENT_AMBULATORY_CARE_PROVIDER_SITE_OTHER): Payer: Self-pay

## 2017-10-04 LAB — BASIC METABOLIC PANEL
BUN / CREAT RATIO: 4 — AB (ref 9–23)
BUN: 3 mg/dL — ABNORMAL LOW (ref 6–20)
CHLORIDE: 107 mmol/L — AB (ref 96–106)
CO2: 18 mmol/L — ABNORMAL LOW (ref 20–29)
Calcium: 8.8 mg/dL (ref 8.7–10.2)
Creatinine, Ser: 0.73 mg/dL (ref 0.57–1.00)
GFR calc non Af Amer: 117 mL/min/{1.73_m2} (ref >59)
GFR, EST AFRICAN AMERICAN: 135 mL/min/{1.73_m2} (ref >59)
Glucose: 88 mg/dL (ref 65–99)
POTASSIUM: 3.4 mmol/L — AB (ref 3.5–5.2)
Sodium: 141 mmol/L (ref 134–144)

## 2017-10-04 LAB — IRON,TIBC AND FERRITIN PANEL
FERRITIN: 6 ng/mL — AB (ref 15–150)
Iron Saturation: 4 % — CL (ref 15–55)
Iron: 26 ug/dL — ABNORMAL LOW (ref 27–159)
Total Iron Binding Capacity: 638 ug/dL (ref 250–450)
UIBC: 612 ug/dL — ABNORMAL HIGH (ref 131–425)

## 2017-10-04 LAB — HEMOGLOBIN A1C
Est. average glucose Bld gHb Est-mCnc: 108 mg/dL
Hgb A1c MFr Bld: 5.4 % (ref 4.8–5.6)

## 2017-10-04 LAB — ANTIBODY SCREEN: Antibody Screen: NEGATIVE

## 2017-10-06 ENCOUNTER — Other Ambulatory Visit: Payer: Self-pay | Admitting: Certified Nurse Midwife

## 2017-10-06 ENCOUNTER — Inpatient Hospital Stay: Payer: Medicaid Other | Admitting: Oncology

## 2017-10-07 ENCOUNTER — Observation Stay
Admission: EM | Admit: 2017-10-07 | Discharge: 2017-10-08 | Disposition: A | Discharge status: Home / Self Care | Payer: Medicaid Other | Attending: Obstetrics & Gynecology | Admitting: Obstetrics & Gynecology

## 2017-10-07 ENCOUNTER — Other Ambulatory Visit: Payer: Self-pay

## 2017-10-07 DIAGNOSIS — O99213 Obesity complicating pregnancy, third trimester: Secondary | ICD-10-CM | POA: Diagnosis not present

## 2017-10-07 DIAGNOSIS — Z3A36 36 weeks gestation of pregnancy: Secondary | ICD-10-CM | POA: Diagnosis not present

## 2017-10-07 DIAGNOSIS — O26893 Other specified pregnancy related conditions, third trimester: Secondary | ICD-10-CM | POA: Diagnosis not present

## 2017-10-07 DIAGNOSIS — Z880 Allergy status to penicillin: Secondary | ICD-10-CM | POA: Insufficient documentation

## 2017-10-07 DIAGNOSIS — M545 Low back pain, unspecified: Secondary | ICD-10-CM | POA: Diagnosis present

## 2017-10-07 DIAGNOSIS — O0933 Supervision of pregnancy with insufficient antenatal care, third trimester: Secondary | ICD-10-CM | POA: Insufficient documentation

## 2017-10-07 DIAGNOSIS — O9989 Other specified diseases and conditions complicating pregnancy, childbirth and the puerperium: Secondary | ICD-10-CM

## 2017-10-07 MED ORDER — LACTATED RINGERS IV BOLUS
500.0000 mL | Freq: Once | INTRAVENOUS | Status: AC
  Administered 2017-10-07: 500 mL via INTRAVENOUS

## 2017-10-07 MED ORDER — LACTATED RINGERS IV SOLN: INTRAVENOUS | Status: DC

## 2017-10-07 MED ORDER — ACETAMINOPHEN 325 MG PO TABS: 650.0000 mg | ORAL_TABLET | ORAL | Status: DC | PRN

## 2017-10-07 MED ORDER — ONDANSETRON HCL 4 MG/2ML IJ SOLN: 4.0000 mg | Freq: Four times a day (QID) | INTRAMUSCULAR | Status: DC | PRN

## 2017-10-07 NOTE — Final Progress Note (Signed)
Physician Final Progress Note  Patient ID: Sharon Dean MRN: [id number] DOB/AGE: [birth year] 21 y.o.  Admit date: 10/07/2017 Admitting provider: Nadara Mustard, MD Discharge date: 10/08/2017  Admission Diagnoses: Low back pain, 36 weeks pregnancy  Discharge Diagnoses:  Principal Problem:   Low back pain   Same  Consults: None  Significant Findings/ Diagnostic Studies:  Obstetrics Admission History & Physical   CC: Low Back Pain  HPI:  22 y.o. G3O7564 @ [redacted]w[redacted]d (11/02/2017, by Ultrasound). Admitted on 10/07/2017:   Patient Active Problem List   Diagnosis Date Noted  . Low back pain 10/07/2017  . Insufficient prenatal care 10/03/2017  . Microcytic hypochromic anemia 10/02/2017  . Constipation in pregnancy in third trimester 10/01/2017  . Hypokalemia 10/01/2017  . Nausea and vomiting 10/01/2017  . Spotting affecting pregnancy in third trimester 09/30/2017  . Obesity (BMI 30.0-34.9)   . Supervision of high risk pregnancy, antepartum 06/19/2017  . Nausea and vomiting of pregnancy, antepartum 06/19/2017  . 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 Suicidal ideation 12/05/2014     Presents for low back pain and mild ctxs noted, reports ctxs not the main concern. No VB or ROM, good FM.  Pain is lower back, mild-mod, no radiation, no recent infection or trauma, assoc w diarrhea, started one day ago, no modifiers.  Prenatal care at: at [facility] Pregnancy complicated by limited / late onset PNC, anemia..  ROS: A review of systems was performed and negative, except as stated in the above HPI. PMHx:  Past Medical History:  Diagnosis Date  . Bipolar 1 disorder (HCC)   . Obesity (BMI 30.0-34.9)    PSHx: No past surgical history on file. Medications:  No medications prior to admission.   Allergies: is allergic to penicillins. OBHx:  OB History  Gravida Para Term Preterm AB Living  3 2 2     2   SAB TAB Ectopic Multiple Live Births        0 2    #  Outcome Date GA Lbr Len/2nd Weight Sex Delivery Anes PTL Lv  3 Current           2 Term 01/19/16 [redacted]w[redacted]d  7 lb 1.2 oz (3.21 kg) M Vag-Spont None  LIV  1 Term 12/30/11    M Vag-Spont  N LIV   PPI:RJJOACZY/SAYTKZSWFUXN except as detailed in HPI.Marland Kitchen  No family history of birth defects. Soc Hx: Alcohol: none and Recreational drug use: none  Objective:   Vitals:   10/07/17 2049  BP: 110/77  Pulse: 79  Resp: 16  Temp: 98.2 F (36.8 C)   Constitutional: Well nourished, well developed female in no acute distress.  HEENT: normal Skin: Warm and dry.  Cardiovascular:Regular rate and rhythm.   Extremity: trace to 1+ bilateral pedal edema Respiratory: Clear to auscultation bilateral. Normal respiratory effort Abdomen: mild Back: no CVAT Neuro: DTRs 2+, Cranial nerves grossly intact Psych: Alert and Oriented x3. No memory deficits. Normal mood and affect.  MS: normal gait, normal bilateral lower extremity ROM/strength/stability.  Pelvic exam: is not limited by body habitus EGBUS: within normal limits Vagina: within normal limits and with normal mucosa blood in the vault Cervix: 1-2/thick/high Uterus: Uterus demonstrates irritability pattern.  Adnexa: not evaluated  EFM:FHR: 140 bpm, variability: moderate,  accelerations:  Present,  decelerations:  Absent Toco: None   Perinatal info:  Blood type: O positive Rubella- Immune Varicella -Immune TDaP declijned RPR NR / HIV Neg/ HBsAg Neg   Procedures: A NST procedure was performed  with FHR monitoring and a normal baseline established, appropriate time of 20-40 minutes of evaluation, and accels >2 seen w 15x15 characteristics.  Results show a REACTIVE NST.   A/P:  Low back pain, 36 weeks, No s/sx PTL No s/sx UTI Heat, rest, Tylenol, Monitor for signs of labor  Discharge Condition: good  Disposition:  Discharge disposition: 01-Home or Self Care       Diet: Regular diet  Discharge Activity: Activity as tolerated   Allergies  as of 10/08/2017      Reactions   Penicillins Anaphylaxis, Other (See Comments), Swelling   Throat Childhood allergy, mother told her she was allergic.       Medication List    ASK your doctor about these medications   famotidine 20 MG tablet Commonly known as:  PEPCID Take 1 tablet (20 mg total) by mouth 2 (two) times daily.   FUSION 65-65-25-30 MG Caps Take 1 tablet by mouth daily.   polyethylene glycol powder powder Commonly known as:  MIRALAX Take 17 GM daily in juice or water   potassium chloride SA 20 MEQ tablet Commonly known as:  K-DUR,KLOR-CON Take 1 tablet (20 mEq total) by mouth 2 (two) times daily for 3 days.        Total time spent taking care of this patient: 15 minutes  Signed: Letitia Libra 10/08/2017, 8:04 AM

## 2017-10-07 NOTE — OB Triage Note (Signed)
Pt came in via EMS c/o back pain and diarrhea started 7/76. No LOF. Vitals WNL.  Monitors applied and will continue to

## 2017-10-08 DIAGNOSIS — O26893 Other specified pregnancy related conditions, third trimester: Secondary | ICD-10-CM | POA: Diagnosis not present

## 2017-10-08 LAB — URINALYSIS, COMPLETE (UACMP) WITH MICROSCOPIC
Bilirubin Urine: NEGATIVE
GLUCOSE, UA: NEGATIVE mg/dL
Hgb urine dipstick: NEGATIVE
Ketones, ur: 5 mg/dL — AB
Leukocytes, UA: NEGATIVE
Nitrite: NEGATIVE
PH: 7 (ref 5.0–8.0)
Protein, ur: NEGATIVE mg/dL
SPECIFIC GRAVITY, URINE: 1.01 (ref 1.005–1.030)

## 2017-10-08 NOTE — Discharge Summary (Signed)
  See FPN 

## 2017-10-11 ENCOUNTER — Ambulatory Visit (INDEPENDENT_AMBULATORY_CARE_PROVIDER_SITE_OTHER): Payer: Medicaid Other

## 2017-10-11 ENCOUNTER — Ambulatory Visit (INDEPENDENT_AMBULATORY_CARE_PROVIDER_SITE_OTHER): Payer: Medicaid Other | Admitting: Obstetrics & Gynecology

## 2017-10-11 VITALS — BP 100/60 | Wt 211.0 lb

## 2017-10-11 DIAGNOSIS — O26843 Uterine size-date discrepancy, third trimester: Secondary | ICD-10-CM | POA: Diagnosis not present

## 2017-10-11 DIAGNOSIS — Z3A36 36 weeks gestation of pregnancy: Secondary | ICD-10-CM

## 2017-10-11 DIAGNOSIS — Z3483 Encounter for supervision of other normal pregnancy, third trimester: Secondary | ICD-10-CM

## 2017-10-11 DIAGNOSIS — O0990 Supervision of high risk pregnancy, unspecified, unspecified trimester: Secondary | ICD-10-CM

## 2017-10-11 NOTE — Patient Instructions (Signed)
Braxton Hicks Contractions °Contractions of the uterus can occur throughout pregnancy, but they are not always a sign that you are in labor. You may have practice contractions called Braxton Hicks contractions. These false labor contractions are sometimes confused with true labor. °What are Braxton Hicks contractions? °Braxton Hicks contractions are tightening movements that occur in the muscles of the uterus before labor. Unlike true labor contractions, these contractions do not result in opening (dilation) and thinning of the cervix. Toward the end of pregnancy (32-34 weeks), Braxton Hicks contractions can happen more often and may become stronger. These contractions are sometimes difficult to tell apart from true labor because they can be very uncomfortable. You should not feel embarrassed if you go to the hospital with false labor. °Sometimes, the only way to tell if you are in true labor is for your health care provider to look for changes in the cervix. The health care provider will do a physical exam and may monitor your contractions. If you are not in true labor, the exam should show that your cervix is not dilating and your water has not broken. °If there are other health problems associated with your pregnancy, it is completely safe for you to be sent home with false labor. You may continue to have Braxton Hicks contractions until you go into true labor. °How to tell the difference between true labor and false labor °True labor °· Contractions last 30-70 seconds. °· Contractions become very regular. °· Discomfort is usually felt in the top of the uterus, and it spreads to the lower abdomen and low back. °· Contractions do not go away with walking. °· Contractions usually become more intense and increase in frequency. °· The cervix dilates and gets thinner. °False labor °· Contractions are usually shorter and not as strong as true labor contractions. °· Contractions are usually irregular. °· Contractions  are often felt in the front of the lower abdomen and in the groin. °· Contractions may go away when you walk around or change positions while lying down. °· Contractions get weaker and are shorter-lasting as time goes on. °· The cervix usually does not dilate or become thin. °Follow these instructions at home: °· Take over-the-counter and prescription medicines only as told by your health care provider. °· Keep up with your usual exercises and follow other instructions from your health care provider. °· Eat and drink lightly if you think you are going into labor. °· If Braxton Hicks contractions are making you uncomfortable: °? Change your position from lying down or resting to walking, or change from walking to resting. °? Sit and rest in a tub of warm water. °? Drink enough fluid to keep your urine pale yellow. Dehydration may cause these contractions. °? Do slow and deep breathing several times an hour. °· Keep all follow-up prenatal visits as told by your health care provider. This is important. °Contact a health care provider if: °· You have a fever. °· You have continuous pain in your abdomen. °Get help right away if: °· Your contractions become stronger, more regular, and closer together. °· You have fluid leaking or gushing from your vagina. °· You pass blood-tinged mucus (bloody show). °· You have bleeding from your vagina. °· You have low back pain that you never had before. °· You feel your baby’s head pushing down and causing pelvic pressure. °· Your baby is not moving inside you as much as it used to. °Summary °· Contractions that occur before labor are called Braxton   Hicks contractions, false labor, or practice contractions. °· Braxton Hicks contractions are usually shorter, weaker, farther apart, and less regular than true labor contractions. True labor contractions usually become progressively stronger and regular and they become more frequent. °· Manage discomfort from Braxton Hicks contractions by  changing position, resting in a warm bath, drinking plenty of water, or practicing deep breathing. °This information is not intended to replace advice given to you by your health care provider. Make sure you discuss any questions you have with your health care provider. °Document Released: 07/14/2016 Document Revised: 07/14/2016 Document Reviewed: 07/14/2016 °Elsevier Interactive Patient Education © 2018 [employer] Inc. ° °

## 2017-10-11 NOTE — Progress Notes (Signed)
  Subjective  Fetal Movement? yes Contractions? no Leaking Fluid? no Vaginal Bleeding? no  Objective  BP 100/60   Wt 211 lb (95.7 kg)   LMP 02/03/2017 (Exact Date)   BMI 34.06 kg/m  General: NAD Pumonary: no increased work of breathing Abdomen: gravid, non-tender Extremities: no edema Psychiatric: mood appropriate, affect full  Assessment  21 y.o. [id number] at 3632w6d by  11/02/2017, by Ultrasound presenting for routine prenatal visit  Plan   Problem List Items Addressed This Visit      Other   Supervision of high risk pregnancy, antepartum    Other Visit Diagnoses    [redacted] weeks gestation of pregnancy    -  Primary    Review of ULTRASOUND.    I have personally reviewed images and report of [facility].    Plan of management to be discussed with patient.  Annamarie MajorPaul Ariannah Arenson, MD, Merlinda FrederickFACOG [facility], [facility] 10/11/2017  3:34 PM

## 2017-10-17 ENCOUNTER — Other Ambulatory Visit: Payer: Self-pay

## 2017-10-17 ENCOUNTER — Observation Stay
Admission: EM | Admit: 2017-10-17 | Discharge: 2017-10-17 | Disposition: A | Discharge status: Home / Self Care | Payer: Medicaid Other | Attending: Obstetrics and Gynecology | Admitting: Obstetrics and Gynecology

## 2017-10-17 DIAGNOSIS — Z880 Allergy status to penicillin: Secondary | ICD-10-CM | POA: Diagnosis not present

## 2017-10-17 DIAGNOSIS — Z87892 Personal history of anaphylaxis: Secondary | ICD-10-CM | POA: Diagnosis not present

## 2017-10-17 DIAGNOSIS — O4290 Premature rupture of membranes, unspecified as to length of time between rupture and onset of labor, unspecified weeks of gestation: Secondary | ICD-10-CM | POA: Diagnosis present

## 2017-10-17 DIAGNOSIS — O471 False labor at or after 37 completed weeks of gestation: Secondary | ICD-10-CM

## 2017-10-17 DIAGNOSIS — O0990 Supervision of high risk pregnancy, unspecified, unspecified trimester: Secondary | ICD-10-CM

## 2017-10-17 DIAGNOSIS — Z0371 Encounter for suspected problem with amniotic cavity and membrane ruled out: Secondary | ICD-10-CM | POA: Diagnosis not present

## 2017-10-17 DIAGNOSIS — Z3A37 37 weeks gestation of pregnancy: Secondary | ICD-10-CM | POA: Diagnosis not present

## 2017-10-17 LAB — URINALYSIS, ROUTINE W REFLEX MICROSCOPIC
BILIRUBIN URINE: NEGATIVE
Glucose, UA: NEGATIVE mg/dL
HGB URINE DIPSTICK: NEGATIVE
Ketones, ur: NEGATIVE mg/dL
LEUKOCYTES UA: NEGATIVE
NITRITE: NEGATIVE
PH: 7 (ref 5.0–8.0)
Protein, ur: 30 mg/dL — AB
SPECIFIC GRAVITY, URINE: 1.014 (ref 1.005–1.030)

## 2017-10-17 LAB — ROM PLUS (ARMC ONLY): ROM PLUS: NEGATIVE

## 2017-10-17 MED ORDER — ALUM & MAG HYDROXIDE-SIMETH 200-200-20 MG/5ML PO SUSP
ORAL | Status: AC
  Administered 2017-10-17: 30 mL via ORAL
  Filled 2017-10-17: qty 30

## 2017-10-17 MED ORDER — ACETAMINOPHEN 500 MG PO TABS
1000.0000 mg | ORAL_TABLET | Freq: Once | ORAL | Status: AC
  Administered 2017-10-17: 1000 mg via ORAL

## 2017-10-17 MED ORDER — ACETAMINOPHEN 500 MG PO TABS
ORAL_TABLET | ORAL | Status: AC
  Administered 2017-10-17: 1000 mg via ORAL
  Filled 2017-10-17: qty 2

## 2017-10-17 MED ORDER — ALUM & MAG HYDROXIDE-SIMETH 200-200-20 MG/5ML PO SUSP
30.0000 mL | Freq: Once | ORAL | Status: AC
  Administered 2017-10-17: 30 mL via ORAL

## 2017-10-17 NOTE — OB Triage Note (Signed)
Patient presented to L&D with complaints of leaking fluid that started 2 days ago around the same time as she started to have "softness" in stomach. Denies vaginal bleeding or decreased fetal movement.  Reports frequent urges to urinate but "unable to go"

## 2017-10-18 ENCOUNTER — Encounter: Payer: Medicaid Other | Admitting: Advanced Practice Midwife

## 2017-10-19 ENCOUNTER — Inpatient Hospital Stay
Admission: EM | Admit: 2017-10-19 | Discharge: 2017-10-21 | DRG: 806 | Disposition: A | Discharge status: Home / Self Care | Payer: Medicaid Other | Attending: Obstetrics and Gynecology | Admitting: Obstetrics and Gynecology

## 2017-10-19 ENCOUNTER — Other Ambulatory Visit: Payer: Self-pay

## 2017-10-19 DIAGNOSIS — O99344 Other mental disorders complicating childbirth: Secondary | ICD-10-CM | POA: Diagnosis not present

## 2017-10-19 DIAGNOSIS — O0990 Supervision of high risk pregnancy, unspecified, unspecified trimester: Secondary | ICD-10-CM

## 2017-10-19 DIAGNOSIS — Z3A38 38 weeks gestation of pregnancy: Secondary | ICD-10-CM

## 2017-10-19 DIAGNOSIS — F319 Bipolar disorder, unspecified: Secondary | ICD-10-CM

## 2017-10-19 DIAGNOSIS — Z880 Allergy status to penicillin: Secondary | ICD-10-CM | POA: Diagnosis not present

## 2017-10-19 DIAGNOSIS — E669 Obesity, unspecified: Secondary | ICD-10-CM | POA: Diagnosis present

## 2017-10-19 DIAGNOSIS — O0933 Supervision of pregnancy with insufficient antenatal care, third trimester: Secondary | ICD-10-CM

## 2017-10-19 DIAGNOSIS — D62 Acute posthemorrhagic anemia: Secondary | ICD-10-CM | POA: Diagnosis not present

## 2017-10-19 DIAGNOSIS — O9081 Anemia of the puerperium: Secondary | ICD-10-CM | POA: Diagnosis not present

## 2017-10-19 DIAGNOSIS — Z3483 Encounter for supervision of other normal pregnancy, third trimester: Secondary | ICD-10-CM | POA: Diagnosis present

## 2017-10-19 DIAGNOSIS — O0930 Supervision of pregnancy with insufficient antenatal care, unspecified trimester: Secondary | ICD-10-CM

## 2017-10-19 DIAGNOSIS — D509 Iron deficiency anemia, unspecified: Secondary | ICD-10-CM | POA: Diagnosis present

## 2017-10-19 DIAGNOSIS — E66811 Obesity, class 1: Secondary | ICD-10-CM | POA: Diagnosis present

## 2017-10-19 LAB — CBC
HCT: 25.3 % — ABNORMAL LOW (ref 35.0–47.0)
Hemoglobin: 8 g/dL — ABNORMAL LOW (ref 12.0–16.0)
MCH: 21.1 pg — ABNORMAL LOW (ref 26.0–34.0)
MCHC: 31.5 g/dL — ABNORMAL LOW (ref 32.0–36.0)
MCV: 67.1 fL — ABNORMAL LOW (ref 80.0–100.0)
PLATELETS: 213 10*3/uL (ref 150–440)
RBC: 3.77 MIL/uL — AB (ref 3.80–5.20)
RDW: 19.9 % — AB (ref 11.5–14.5)
WBC: 8.7 10*3/uL (ref 3.6–11.0)

## 2017-10-19 LAB — URINE DRUG SCREEN, QUALITATIVE (ARMC ONLY)
Amphetamines, Ur Screen: NOT DETECTED
BARBITURATES, UR SCREEN: NOT DETECTED
CANNABINOID 50 NG, UR ~~LOC~~: NOT DETECTED
Cocaine Metabolite,Ur ~~LOC~~: NOT DETECTED
MDMA (Ecstasy)Ur Screen: NOT DETECTED
Methadone Scn, Ur: NOT DETECTED
Opiate, Ur Screen: NOT DETECTED
PHENCYCLIDINE (PCP) UR S: NOT DETECTED
TRICYCLIC, UR SCREEN: NOT DETECTED

## 2017-10-19 LAB — OB RESULTS CONSOLE GBS: GBS: NEGATIVE

## 2017-10-19 LAB — TYPE AND SCREEN
ABO/RH(D): O POS
ANTIBODY SCREEN: NEGATIVE

## 2017-10-19 MED ORDER — FENTANYL 2.5 MCG/ML W/ROPIVACAINE 0.15% IN NS 100 ML EPIDURAL (ARMC)
EPIDURAL | Status: AC
  Filled 2017-10-19: qty 100

## 2017-10-19 MED ORDER — CALCIUM CARBONATE ANTACID 500 MG PO CHEW
CHEWABLE_TABLET | ORAL | Status: AC
  Administered 2017-10-19: 200 mg via ORAL
  Filled 2017-10-19: qty 1

## 2017-10-19 MED ORDER — WITCH HAZEL-GLYCERIN EX PADS: 1.0000 "application " | MEDICATED_PAD | CUTANEOUS | Status: DC | PRN

## 2017-10-19 MED ORDER — OXYTOCIN 40 UNITS IN LACTATED RINGERS INFUSION - SIMPLE MED
2.5000 [IU]/h | INTRAVENOUS | Status: DC
  Administered 2017-10-19: 2.5 [IU]/h via INTRAVENOUS
  Filled 2017-10-19: qty 1000

## 2017-10-19 MED ORDER — LACTATED RINGERS IV SOLN: 500.0000 mL | INTRAVENOUS | Status: DC | PRN

## 2017-10-19 MED ORDER — LIDOCAINE HCL (PF) 1 % IJ SOLN
30.0000 mL | INTRAMUSCULAR | Status: DC | PRN
  Filled 2017-10-19: qty 30

## 2017-10-19 MED ORDER — ACETAMINOPHEN 325 MG PO TABS
650.0000 mg | ORAL_TABLET | ORAL | Status: DC | PRN
  Administered 2017-10-19: 650 mg via ORAL
  Filled 2017-10-19: qty 2

## 2017-10-19 MED ORDER — DIPHENHYDRAMINE HCL 25 MG PO CAPS: 25.0000 mg | ORAL_CAPSULE | Freq: Four times a day (QID) | ORAL | Status: DC | PRN

## 2017-10-19 MED ORDER — PRENATAL MULTIVITAMIN CH
1.0000 | ORAL_TABLET | Freq: Every day | ORAL | Status: DC
  Filled 2017-10-19 (×2): qty 1

## 2017-10-19 MED ORDER — OXYTOCIN 10 UNIT/ML IJ SOLN
10.0000 [IU] | Freq: Once | INTRAMUSCULAR | Status: DC
  Filled 2017-10-19: qty 1

## 2017-10-19 MED ORDER — OXYTOCIN BOLUS FROM INFUSION
500.0000 mL | Freq: Once | INTRAVENOUS | Status: AC
  Administered 2017-10-19: 500 mL via INTRAVENOUS

## 2017-10-19 MED ORDER — ONDANSETRON HCL 4 MG/2ML IJ SOLN: 4.0000 mg | INTRAMUSCULAR | Status: DC | PRN

## 2017-10-19 MED ORDER — SOD CITRATE-CITRIC ACID 500-334 MG/5ML PO SOLN: 30.0000 mL | ORAL | Status: DC | PRN

## 2017-10-19 MED ORDER — BENZOCAINE-MENTHOL 20-0.5 % EX AERO: 1.0000 "application " | INHALATION_SPRAY | CUTANEOUS | Status: DC | PRN

## 2017-10-19 MED ORDER — CALCIUM CARBONATE ANTACID 500 MG PO CHEW
1.0000 | CHEWABLE_TABLET | ORAL | Status: DC | PRN
  Administered 2017-10-19: 200 mg via ORAL

## 2017-10-19 MED ORDER — IBUPROFEN 600 MG PO TABS
600.0000 mg | ORAL_TABLET | Freq: Four times a day (QID) | ORAL | Status: DC
  Administered 2017-10-19 – 2017-10-20 (×3): 600 mg via ORAL
  Filled 2017-10-19 (×3): qty 1

## 2017-10-19 MED ORDER — FERROUS SULFATE 325 (65 FE) MG PO TABS
325.0000 mg | ORAL_TABLET | Freq: Two times a day (BID) | ORAL | Status: DC
  Filled 2017-10-19 (×2): qty 1

## 2017-10-19 MED ORDER — COCONUT OIL OIL: 1.0000 "application " | TOPICAL_OIL | Status: DC | PRN

## 2017-10-19 MED ORDER — MISOPROSTOL 200 MCG PO TABS
ORAL_TABLET | ORAL | Status: AC
  Filled 2017-10-19: qty 4

## 2017-10-19 MED ORDER — HYDROCODONE-ACETAMINOPHEN 5-325 MG PO TABS: 1.0000 | ORAL_TABLET | Freq: Four times a day (QID) | ORAL | Status: DC | PRN

## 2017-10-19 MED ORDER — DIBUCAINE 1 % RE OINT: 1.0000 "application " | TOPICAL_OINTMENT | RECTAL | Status: DC | PRN

## 2017-10-19 MED ORDER — ONDANSETRON HCL 4 MG PO TABS: 4.0000 mg | ORAL_TABLET | ORAL | Status: DC | PRN

## 2017-10-19 MED ORDER — LACTATED RINGERS IV SOLN
INTRAVENOUS | Status: DC
  Administered 2017-10-19: 09:00:00 via INTRAVENOUS

## 2017-10-19 MED ORDER — SIMETHICONE 80 MG PO CHEW: 80.0000 mg | CHEWABLE_TABLET | ORAL | Status: DC | PRN

## 2017-10-19 MED ORDER — ONDANSETRON HCL 4 MG/2ML IJ SOLN: 4.0000 mg | Freq: Four times a day (QID) | INTRAMUSCULAR | Status: DC | PRN

## 2017-10-19 MED ORDER — SENNOSIDES-DOCUSATE SODIUM 8.6-50 MG PO TABS: 2.0000 | ORAL_TABLET | ORAL | Status: DC

## 2017-10-19 NOTE — Discharge Summary (Addendum)
OB Discharge Summary     Patient Name: Sharon Dean DOB: Aug 02, 1995 MRN: [id number]  Date of admission: 10/19/2017 Delivering MD: Thomasene MohairStephen Jackson, MD  Date of Delivery: 10/19/2017  Date of discharge: 10/21/2017  Admitting diagnosis: 38 weeks Intrauterine pregnancy: 2144w0d     Secondary diagnosis: Anemia (microcytic, hypochromic) due to iron deficiency     Discharge diagnosis: Term Pregnancy Delivered and Anemia (microcytic, hypochromic) due to iron deficiency                                                                                               Post partum procedures:none  Augmentation: none  Complications: None  Hospital course:  Onset of Labor With Vaginal Delivery     22 y.o. yo G3P2002 at 3644w0d was admitted in Active Labor on 10/19/2017. Patient had an uncomplicated labor course as follows:  Membrane Rupture Time/Date: 7:00 AM ,10/19/2017   Intrapartum Procedures: Episiotomy: None [1]                                         Lacerations:  None [1]  Patient had a delivery of a Viable infant. 10/19/2017  Information for the patient's newborn:  Halina AndreasBland, Boy Jina [[id number]][[id number]]  Delivery Method: Vag-Spont    Pateint had an uncomplicated postpartum course.  She is ambulating, tolerating a regular diet, passing flatus, and urinating well. Patient is discharged home in stable condition on 10/21/17.   Physical exam  Vitals:   10/20/17 0753 10/20/17 1555 10/20/17 2334 10/21/17 0748  BP: 103/76 101/66 101/67 115/72  Pulse: (!) 59 64 70 62  Resp: 18 18 20 18   Temp: 98.6 F (37 C)  98.6 F (37 C) 98.7 F (37.1 C)  TempSrc: Oral  Oral Oral  SpO2: 99% 100% 100% 100%  Weight:      Height:       General: alert, cooperative and no distress Lochia: appropriate Uterine Fundus: firm Incision: N/A DVT Evaluation: No evidence of DVT seen on physical exam. No cords or calf tenderness. No significant calf/ankle edema.  Labs: Lab Results  Component Value Date   WBC 13.2 (H)  10/20/2017   HGB 7.9 (L) 10/20/2017   HCT 25.6 (L) 10/20/2017   MCV 67.7 (L) 10/20/2017   PLT 204 10/20/2017    Discharge instruction: per After Visit Summary.  Medications:  Allergies as of 10/21/2017      Reactions   Penicillins Anaphylaxis, Other (See Comments), Swelling   Throat Childhood allergy, mother told her she was allergic.       Medication List    STOP taking these medications   famotidine 20 MG tablet Commonly known as:  PEPCID   polyethylene glycol powder powder Commonly known as:  GLYCOLAX/MIRALAX   potassium chloride SA 20 MEQ tablet Commonly known as:  K-DUR,KLOR-CON     TAKE these medications   FUSION 65-65-25-30 MG Caps Take 1 tablet by mouth daily.   multivitamin-prenatal 27-0.8 MG Tabs tablet Take 1 tablet by mouth daily at 12 noon.  Discharge Care Instructions  (From admission, onward)         Start     Ordered   10/21/17 0000  Discharge wound care:    Comments:  Perform wound care instructions   10/21/17 1059          Diet: routine diet  Activity: Advance as tolerated. Pelvic rest for 6 weeks.   Outpatient follow up: Follow-up Information    Conard Novak, MD. Schedule an appointment as soon as possible for a visit in 2 week(s).   Specialty:  Obstetrics and Gynecology Why:  postpartum mood check Contact information: [address] [place] [address] [phone]             Postpartum contraception: IUD Mirena Rhogam Given postpartum: no Rubella vaccine given postpartum: no Varicella vaccine given postpartum: no TDaP given antepartum or postpartum: declined antepartum  Newborn Data: Live born female  Birth Weight: 8 lb 3.2 oz (3720 g) APGAR: 8, 9  Newborn Delivery   Birth date/time:  10/19/2017 13:52:00 Delivery type:  Vaginal, Spontaneous     Baby Feeding: Bottle  Disposition:home with mother  SIGNED: Thomasene Mohair, MD, [facility], [facility] 10/21/2017 10:59 AM

## 2017-10-19 NOTE — H&P (Signed)
OB History & Physical   History of Present Illness:  Chief Complaint: water broke at 7 am this morning  HPI:  Sharon Dean is a 22 y.o. G21P2002 female at [redacted]w[redacted]d dated by ultrasound.  Her pregnancy has been complicated by insufficient prenatal care, bipolar, history of suicidal ideation.    She reports feeling some contractions.   She reports leakage of fluid since 7 am.   She denies vaginal bleeding.   She reports fetal movement.    Maternal Medical History:   Past Medical History:  Diagnosis Date  . Bipolar 1 disorder (HCC)   . Obesity (BMI 30.0-34.9)     No past surgical history on file.  Allergies  Allergen Reactions  . Penicillins Anaphylaxis, Other (See Comments) and Swelling    Throat Childhood allergy, mother told her she was allergic.      Prior to Admission medications   Medication Sig Start Date End Date Taking? Authorizing Provider  Fe Fum-Fe Poly-Vit C-Lactobac (FUSION) 65-65-25-30 MG CAPS Take 1 tablet by mouth daily. 10/01/17  Yes Farrel Conners, CNM  Prenatal Vit-Fe Fumarate-FA (MULTIVITAMIN-PRENATAL) 27-0.8 MG TABS tablet Take 1 tablet by mouth daily at 12 noon.   Yes [provider]  famotidine (PEPCID) 20 MG tablet Take 1 tablet (20 mg total) by mouth 2 (two) times daily. Patient not taking: Reported on 10/07/2017 10/01/17   Farrel Conners, CNM  polyethylene glycol powder [facility]) powder Take 17 GM daily in juice or water Patient not taking: Reported on 10/07/2017 10/01/17   Farrel Conners, CNM  potassium chloride (K-DUR,KLOR-CON) 20 MEQ tablet Take 1 tablet (20 mEq total) by mouth 2 (two) times daily for 3 days. 10/01/17 10/04/17  Farrel Conners, CNM    OB History  Gravida Para Term Preterm AB Living  3 2 2     2   SAB TAB Ectopic Multiple Live Births        0 2    # Outcome Date GA Lbr Len/2nd Weight Sex Delivery Anes PTL Lv  3 Current           2 Term 01/19/16 [redacted]w[redacted]d  3210 g M Vag-Spont None  LIV  1 Term 12/30/11 103w0d  3289 g Sharon Dean LIV    Prenatal care site: [facility]  Social History: She  reports that she has never smoked. She has never used smokeless tobacco. She reports that she does not drink alcohol or use drugs.  Family History: family history includes Diabetes in her maternal aunt and maternal grandmother; Hypertension in her maternal aunt, maternal grandmother, and mother.    Review of Systems: Negative x 10 systems reviewed except as noted in the HPI.    Physical Exam:  Vital Signs: BP (!) 98/52   Pulse 69   Temp 97.8 F (36.6 C) (Oral)   Resp 18   Ht 5\' 6"  (1.676 m)   Wt 95.7 kg   LMP 02/03/2017 (Exact Date)   BMI 34.06 kg/m  Constitutional: Well nourished, well developed female in no acute distress.  HEENT: normal Skin: Warm and dry.  Cardiovascular: Regular rate and rhythm.   Extremity: reflexes 2+  Respiratory: Clear to auscultation bilateral. Normal respiratory effort Abdomen: FHT present Back: no CVAT Neuro: DTRs 2+, Cranial nerves grossly intact Psych: Alert and Oriented x3. No memory deficits. Normal mood and affect.  MS: normal gait, normal bilateral lower extremity ROM/strength/stability.  Pelvic exam: Per RN Zerita Boers at 7:30 this morning 3/40-50/-3   Pertinent Results:  Prenatal Labs:  Blood type/Rh O positive  Antibody screen negative  Rubella Immune  Varicella Immune    RPR Non-reactive  HBsAg negative  HIV negative  GC negative  Chlamydia negative  Genetic screening Not done  1 hour GTT Hgb A1C 5.4 on 7/23  3 hour GTT NA  GBS negative on 7/20   Baseline FHR: 110 beats/min   Variability: moderate   Accelerations: present   Decelerations: absent Contractions: present frequency: every 2-3 Overall assessment: reassuring    Assessment:  Sharon Dean is a 22 y.o. 623P2002 female at 3380w0d with SROM/contractions.   Plan:  1. Admit to Labor & Delivery  2. CBC, T&S, Clrs, IVF 3. GBS negative.   4. Fetal well-being: Category I 5. Expectant  management for vaginal delivery, augment as needed  Tresea MallJane Jobany Montellano, [facility] 10/19/2017 8:33 AM

## 2017-10-19 NOTE — Lactation Note (Signed)
This note was copied from a baby's chart. Lactation Consultation Note  Patient Name: [name] NWGNF'AToday's Date: 10/19/2017 Reason for consult: Initial assessment   Maternal Data Formula Feeding for Exclusion: No Has patient been taught Hand Expression?: Yes Does the patient have breastfeeding experience prior to this delivery?: Yes  Feeding Feeding Type: Breast Fed Length of feed: 15 min(right side)  LATCH Score Latch: Grasps breast easily, tongue down, lips flanged, rhythmical sucking.  Audible Swallowing: Spontaneous and intermittent  Type of Nipple: Flat  Comfort (Breast/Nipple): Soft / non-tender  Hold (Positioning): Assistance needed to correctly position infant at breast and maintain latch.  LATCH Score: 8  Interventions Interventions: Breast feeding basics reviewed;Assisted with latch;Skin to skin;Adjust position;Support pillows  Lactation Tools Discussed/Used     Consult Status  LC to room to assist with latch of infant. Mother states that she has two older children that she did not breastfeed and is interested in breastfeeding. Infant was skin to skin when LC arrived and was licking lips and rooting. Infant was guided to the breast and assisted to latch. Infant was able to latch successfully with lips flanged and audible swallows heard. Mother was educated about the basics of breastfeeding and hand expression.    Arlyss Gandylicia Nami Strawder 10/19/2017, 3:47 PM

## 2017-10-19 NOTE — Plan of Care (Signed)
Patient progressing in labor, comfort measures and procedures discussed. Doula at bedside

## 2017-10-19 NOTE — Progress Notes (Signed)
Labor Check  Subj:  Complaints: painful contractions, wants epidural, nitrous oxide 50/50 not helping.   Obj:  BP (!) 85/59   Pulse 75   Temp 97.6 F (36.4 C) (Oral)   Resp 18   Ht 5\' 6"  (1.676 m)   Wt 95.7 kg   LMP 02/03/2017 (Exact Date)   BMI 34.06 kg/m     Cervix: Dilation: 7 / Effacement (%): 70 / Station: -2  Baseline FHR: 110 beats/min   Variability: moderate   Accelerations: present   Decelerations: absent Contractions: present frequency: 4-5 q 10 min Overall assessment: cat 1  A/P: 22 y.o. [id number] female at 6749w0d with SROM, active labor.  1.  Labor: expectant management  2.  FWB: reassuring, Overall assessment: category 1  3.  GBS negative  4.  Pain: nitrous oxide, pt wants epidural. Will try to obtain. However, patient is progressing quickly 5.  Recheck: prn   Thomasene MohairStephen Syndey Jaskolski, MD, Merlinda FrederickFACOG [facility], [facility] 10/19/2017 1:20 PM

## 2017-10-19 NOTE — Progress Notes (Signed)
  Labor Progress Note   22 y.o. [id number] @ 4216w0d , admitted for  Pregnancy, Labor Management.   Subjective:  Patient is feeling increasing intensity and frequency of contractions but still coping well  Objective:  BP 110/73 (BP Location: Left Arm)   Pulse 69   Temp 97.7 F (36.5 C) (Oral)   Resp 18   Ht 5\' 6"  (1.676 m)   Wt 95.7 kg   LMP 02/03/2017 (Exact Date)   BMI 34.06 kg/m  Abd: mild Extr: no edema SVE: CERVIX: 4 cm dilated, 60 effaced, -2 station Clear/pink fluid  EFM: FHR: 120 bpm, variability: moderate,  accelerations:  Present,  decelerations:  Absent Toco: Frequency: Every 2-3 minutes Labs: I have reviewed the patient's lab results.   Assessment & Plan:  [id number] @ 2216w0d, admitted for  Pregnancy and Labor/Delivery Management  1. Pain management: position changes. 2. FWB: FHT category I.  3. ID: GBS negative 4. Labor management: expectant management  All discussed with patient, see orders  Tresea MallJane Krystianna Soth, CNM [facility], [facility] 10/19/2017  11:59 AM

## 2017-10-20 LAB — CBC
HCT: 25.6 % — ABNORMAL LOW (ref 35.0–47.0)
HEMOGLOBIN: 7.9 g/dL — AB (ref 12.0–16.0)
MCH: 20.8 pg — ABNORMAL LOW (ref 26.0–34.0)
MCHC: 30.7 g/dL — ABNORMAL LOW (ref 32.0–36.0)
MCV: 67.7 fL — AB (ref 80.0–100.0)
PLATELETS: 204 10*3/uL (ref 150–440)
RBC: 3.78 MIL/uL — AB (ref 3.80–5.20)
RDW: 19.3 % — ABNORMAL HIGH (ref 11.5–14.5)
WBC: 13.2 10*3/uL — AB (ref 3.6–11.0)

## 2017-10-20 LAB — RPR: RPR: NONREACTIVE

## 2017-10-20 MED ORDER — IBUPROFEN 600 MG PO TABS
600.0000 mg | ORAL_TABLET | Freq: Four times a day (QID) | ORAL | Status: DC
  Administered 2017-10-20 – 2017-10-21 (×4): 600 mg via ORAL
  Filled 2017-10-20 (×4): qty 1

## 2017-10-20 NOTE — Progress Notes (Signed)
PPD#1 SVD Subjective:  Cheerful, fatigued. Having some afterpains. Pain control is adequate with PRN medications. Voiding without difficulty. Tolerating a regular diet. Ambulating well.  Objective:   Blood pressure 103/76, pulse (!) 59, temperature 98.6 F (37 C), temperature source Oral, resp. rate 18, height 5\' 6"  (1.676 m), weight 95.7 kg, last menstrual period 02/03/2017, SpO2 99 %, currently breastfeeding.  General: NAD Pulmonary: no increased work of breathing Abdomen: non-distended, non-tender Uterus:  fundus firm; lochia appropriate Extremities: no edema, no erythema, no tenderness, no signs of DVT  Results for orders placed or performed during the hospital encounter of 10/19/17 (from the past 72 hour(s))  OB RESULT CONSOLE Group B Strep     Status: None   Collection Time: 10/19/17 12:00 AM  Result Value Ref Range   GBS Negative   CBC     Status: Abnormal   Collection Time: 10/19/17  8:33 AM  Result Value Ref Range   WBC 8.7 3.6 - 11.0 K/uL   RBC 3.77 (L) 3.80 - 5.20 MIL/uL   Hemoglobin 8.0 (L) 12.0 - 16.0 g/dL   HCT 01.6 (L) 01.0 - 93.2 %   MCV 67.1 (L) 80.0 - 100.0 fL   MCH 21.1 (L) 26.0 - 34.0 pg   MCHC 31.5 (L) 32.0 - 36.0 g/dL   RDW 35.5 (H) 73.2 - 20.2 %   Platelets 213 150 - 440 K/uL    Comment: Performed at [facility], [address] [place], [place] [address]  Type and screen [facility]     Status: None   Collection Time: 10/19/17  8:33 AM  Result Value Ref Range   ABO/RH(D) O POS    Antibody Screen NEG    Sample Expiration      10/22/2017 Performed at [facility], [address] [place], [place] [address]   RPR     Status: None   Collection Time: 10/19/17  8:33 AM  Result Value Ref Range   RPR Ser Ql Non Reactive Non Reactive    Comment: (NOTE) Performed At: [facility] [address] [place], [place] [id number] Jolene Schimke MD YW:[id number]   Urine Drug Screen, Qualitative ([facility] only)      Status: Abnormal   Collection Time: 10/19/17  8:33 AM  Result Value Ref Range   Tricyclic, Ur Screen NONE DETECTED NONE DETECTED   Amphetamines, Ur Screen NONE DETECTED NONE DETECTED   MDMA (Ecstasy)Ur Screen NONE DETECTED NONE DETECTED   Cocaine Metabolite,Ur [place] NONE DETECTED NONE DETECTED   Opiate, Ur Screen NONE DETECTED NONE DETECTED   Phencyclidine (PCP) Ur S NONE DETECTED NONE DETECTED   Cannabinoid 50 Ng, Ur  NONE DETECTED NONE DETECTED   Barbiturates, Ur Screen NONE DETECTED NONE DETECTED   Benzodiazepine, Ur Scrn TEST NOT PERFORMED, REAGENT NOT AVAILABLE (A) NONE DETECTED   Methadone Scn, Ur NONE DETECTED NONE DETECTED    Comment: (NOTE) Tricyclics + metabolites, urine    Cutoff 1000 ng/mL Amphetamines + metabolites, urine  Cutoff 1000 ng/mL MDMA (Ecstasy), urine              Cutoff 500 ng/mL Cocaine Metabolite, urine          Cutoff 300 ng/mL Opiate + metabolites, urine        Cutoff 300 ng/mL Phencyclidine (PCP), urine         Cutoff 25 ng/mL Cannabinoid, urine                 Cutoff 50 ng/mL  Barbiturates + metabolites, urine  Cutoff 200 ng/mL Benzodiazepine, urine              Cutoff 200 ng/mL Methadone, urine                   Cutoff 300 ng/mL The urine drug screen provides only a preliminary, unconfirmed analytical test result and should not be used for non-medical purposes. Clinical consideration and professional judgment should be applied to any positive drug screen result due to possible interfering substances. A more specific alternate chemical method must be used in order to obtain a confirmed analytical result. Gas chromatography / mass spectrometry (GC/MS) is the preferred confirmat ory method. Performed at [facility], [address] [place], [place] [address]   CBC     Status: Abnormal   Collection Time: 10/20/17  6:23 AM  Result Value Ref Range   WBC 13.2 (H) 3.6 - 11.0 K/uL   RBC 3.78 (L) 3.80 - 5.20 MIL/uL   Hemoglobin 7.9 (L) 12.0 -  16.0 g/dL   HCT 60.425.6 (L) 54.035.0 - 98.147.0 %   MCV 67.7 (L) 80.0 - 100.0 fL   MCH 20.8 (L) 26.0 - 34.0 pg   MCHC 30.7 (L) 32.0 - 36.0 g/dL   RDW 19.119.3 (H) 47.811.5 - 29.514.5 %   Platelets 204 150 - 440 K/uL    Comment: Performed at [facility], [address] [place], [place] [address]    Assessment:   22 y.o. [id number] postpartum day # 1 in good condition.  Plan:   1) Acute blood loss anemia - hemodynamically stable and asymptomatic - PO ferrous sulfate  2) Blood Type --/--/O POS (08/08 [id number]) /   3) Rubella 1.71 (04/05 1635) / Varicella Immune / TDAP status: declined 09/20/17.  4) Breast and formula feeding.  5) Contraception: plans Nexplanon  6) Disposition: continue postpartum care. Anticipate discharge tomorrow.  Marcelyn BruinsJacelyn Everlie Eble, CNM 10/20/2017

## 2017-10-20 NOTE — Plan of Care (Signed)
Vs stable; up ad lib; tolerating regular diet; taking motrin and tylenol for pain control; breast and formula feeding

## 2017-10-20 NOTE — Lactation Note (Signed)
This note was copied from a baby's chart. Lactation Consultation Note  Patient Name: Sharon Wyline BeadyKawana Anes HYQMV'HToday's Date: 10/20/2017 Reason for consult: Follow-up assessment   Maternal Data    Feeding Feeding Type: Breast Fed  LATCH Score Latch: Grasps breast easily, tongue down, lips flanged, rhythmical sucking.  Audible Swallowing: Spontaneous and intermittent  Type of Nipple: Everted at rest and after stimulation  Comfort (Breast/Nipple): Soft / non-tender  Hold (Positioning): No assistance needed to correctly position infant at breast.  LATCH Score: 10  Interventions Interventions: Comfort gels  Lactation Tools Discussed/Used     Consult Status Consult Status: Follow-up Date: 10/20/17 Follow-up type: In-patient  LC to room to assist with breastfeeding. Mother states that she felt infant was not getting enough at the breast and gave formula last night. LC reviewed hand expression with mother and showed her that she is producing sufficient colostrum. Infant self latched to right breast with no assistance when placed at the breast. LC reviewed feeding cues, cluster-feeding and what to expect in the first 24 hours.  Mother denies pain with this latch and felt more confident about breastfeeding.  Arlyss Gandylicia Sigismund Cross 10/20/2017, 11:02 AM

## 2017-10-21 MED ORDER — IBUPROFEN 600 MG PO TABS
600.0000 mg | ORAL_TABLET | Freq: Four times a day (QID) | ORAL | Status: DC
  Administered 2017-10-21 (×2): 600 mg via ORAL
  Filled 2017-10-21 (×2): qty 1

## 2017-10-21 NOTE — Progress Notes (Signed)
Pt being discharged tonight; all discharge instructions given to pt; pt will be staying in room 343 due to baby staying for phototherapy

## 2017-10-21 NOTE — Discharge Instructions (Signed)
You may take 2 regular strength tylenol (325mg  per tablet) tablets every 4 hours as needed for pain You may take 3 ibuprofen (300mg  per tablet) tablets every 6 hours as needed for pain

## 2017-10-21 NOTE — Lactation Note (Signed)
This note was copied from a baby's chart. Lactation Consultation Note  Patient Name: Sharon Dean WUJWJ'XToday's Date: 10/21/2017     Maternal Data  Mom desires to formula feed until baby can go home, she states she will breastfeed when baby is d/c'd and bili is down, she states she has had plenty of breastmilk before and doesn't feel like she wants to pump at present to stimulate supply of breastmilk, she breastfed her other child, I encouraged her to call if she decides she wants to pump her breasts or has any questions Feeding    LATCH Score                   Interventions    Lactation Tools Discussed/Used     Consult Status      Dyann KiefMarsha D Lorry Furber 10/21/2017, 5:17 PM

## 2017-10-21 NOTE — Plan of Care (Signed)
VS stable; up ad lib; taking scheduled motrin for pain control; encouraged to feed baby about every 3 hours; encouraged to wake baby if it's been 4 hours since last feeding and either breastfeed or give formula

## 2017-11-02 ENCOUNTER — Inpatient Hospital Stay: Admit: 2017-11-02 | Payer: Self-pay

## 2017-11-14 NOTE — Discharge Summary (Signed)
Physician Discharge Summary   Patient ID: Sharon Dean [id number] 21 y.o. 08-Dec-1995  Admit date: 10/17/2017  Discharge date and time: 10/17/2017  7:16 PM   Admitting Physician: Natale Milch, MD   Discharge Physician: Adelene Idler MD   Admission Diagnoses: 37 weeks preg leaking fluid  Discharge Diagnoses: [redacted] weeks gestation, Amniotic membranes intact   Admission Condition: good  Discharged Condition: good  Indication for Admission: Paitent was admitted for evaluation of suspected rupture of membranes  Hospital Course: Oyuki was admitted to be evaluated for rupture of membranes. There was no fluid on examination by nursing and a ROM+ was sent. The ROm + was negative and she was discharged home in stable condition.   Consults: None  Significant Diagnostic Studies: ROM +, negative  Treatments: None  Discharge Exam: BP 98/61   Pulse 81   Temp 97.7 F (36.5 C) (Oral)   Resp 17   Ht 5\' 6"  (1.676 m)   Wt 95.7 kg   LMP 02/03/2017 (Exact Date)   BMI 34.06 kg/m   General Appearance:    Alert, cooperative, no distress, appears stated age  Head:    Normocephalic, without obvious abnormality, atraumatic  Eyes:    PERRL, conjunctiva/corneas clear, EOM's intact, fundi    benign, both eyes  Ears:    Normal TM's and external ear canals, both ears  Nose:   Nares normal, septum midline, mucosa normal, no drainage    or sinus tenderness  Throat:   Lips, mucosa, and tongue normal; teeth and gums normal  Neck:   Supple, symmetrical, trachea midline, no adenopathy;    thyroid:  no enlargement/tenderness/nodules; no carotid   bruit or JVD  Back:     Symmetric, no curvature, ROM normal, no CVA tenderness  Lungs:     Clear to auscultation bilaterally, respirations unlabored  Chest Wall:    No tenderness or deformity   Heart:    Regular rate and rhythm, S1 and S2 normal, no murmur, rub   or gallop  Breast Exam:    No tenderness, masses, or nipple abnormality  Abdomen:      Soft, non-tender, bowel sounds active all four quadrants,    no masses, no organomegaly  Genitalia:    Normal female without lesion, discharge or tenderness  Rectal:    Normal tone, normal prostate, no masses or tenderness;   guaiac negative stool  Extremities:   Extremities normal, atraumatic, no cyanosis or edema  Pulses:   2+ and symmetric all extremities  Skin:   Skin color, texture, turgor normal, no rashes or lesions  Lymph nodes:   Cervical, supraclavicular, and axillary nodes normal  Neurologic:   CNII-XII intact, normal strength, sensation and reflexes    throughout    Disposition: Discharge disposition: 01-Home or Self Care       Patient Instructions:  Allergies as of 10/17/2017      Reactions   Penicillins Anaphylaxis, Other (See Comments), Swelling   Throat Childhood allergy, mother told her she was allergic.       Medication List    ASK your doctor about these medications   FUSION 65-65-25-30 MG Caps Take 1 tablet by mouth daily.   multivitamin-prenatal 27-0.8 MG Tabs tablet Take 1 tablet by mouth daily at 12 noon.      Activity: activity as tolerated Diet: regular diet Wound Care: none needed  Follow-up with [facility] in 1 week.  Signed: Natale Milch 11/14/2017 2:44 PM

## 2019-02-01 ENCOUNTER — Ambulatory Visit: Payer: Medicaid Other

## 2019-02-06 ENCOUNTER — Ambulatory Visit: Payer: Medicaid Other

## 2019-08-16 ENCOUNTER — Ambulatory Visit: Payer: Self-pay

## 2019-09-15 ENCOUNTER — Other Ambulatory Visit: Payer: Self-pay

## 2019-09-15 ENCOUNTER — Emergency Department: Payer: Medicaid Other

## 2019-09-15 ENCOUNTER — Observation Stay
Admission: EM | Admit: 2019-09-15 | Discharge: 2019-09-16 | Disposition: A | Discharge status: Still In Hospital | Payer: Medicaid Other | Attending: Orthopedic Surgery | Admitting: Orthopedic Surgery

## 2019-09-15 DIAGNOSIS — Z79899 Other long term (current) drug therapy: Secondary | ICD-10-CM | POA: Insufficient documentation

## 2019-09-15 DIAGNOSIS — W03XXXA Other fall on same level due to collision with another person, initial encounter: Secondary | ICD-10-CM | POA: Insufficient documentation

## 2019-09-15 DIAGNOSIS — M25571 Pain in right ankle and joints of right foot: Secondary | ICD-10-CM | POA: Diagnosis present

## 2019-09-15 DIAGNOSIS — Z880 Allergy status to penicillin: Secondary | ICD-10-CM | POA: Insufficient documentation

## 2019-09-15 DIAGNOSIS — F319 Bipolar disorder, unspecified: Secondary | ICD-10-CM | POA: Diagnosis not present

## 2019-09-15 DIAGNOSIS — S82851A Displaced trimalleolar fracture of right lower leg, initial encounter for closed fracture: Secondary | ICD-10-CM | POA: Diagnosis not present

## 2019-09-15 DIAGNOSIS — Z6834 Body mass index (BMI) 34.0-34.9, adult: Secondary | ICD-10-CM | POA: Diagnosis not present

## 2019-09-15 DIAGNOSIS — S82891A Other fracture of right lower leg, initial encounter for closed fracture: Secondary | ICD-10-CM | POA: Diagnosis present

## 2019-09-15 DIAGNOSIS — E669 Obesity, unspecified: Secondary | ICD-10-CM | POA: Diagnosis not present

## 2019-09-15 DIAGNOSIS — Z20822 Contact with and (suspected) exposure to covid-19: Secondary | ICD-10-CM | POA: Diagnosis not present

## 2019-09-15 DIAGNOSIS — M21961 Unspecified acquired deformity of right lower leg: Secondary | ICD-10-CM

## 2019-09-15 DIAGNOSIS — Z87892 Personal history of anaphylaxis: Secondary | ICD-10-CM | POA: Diagnosis not present

## 2019-09-15 LAB — BASIC METABOLIC PANEL
Anion gap: 6 (ref 5–15)
BUN: 7 mg/dL (ref 6–20)
CO2: 22 mmol/L (ref 22–32)
Calcium: 8 mg/dL — ABNORMAL LOW (ref 8.9–10.3)
Chloride: 113 mmol/L — ABNORMAL HIGH (ref 98–111)
Creatinine, Ser: 0.95 mg/dL (ref 0.44–1.00)
GFR calc Af Amer: >60 mL/min (ref >60)
GFR calc non Af Amer: >60 mL/min (ref >60)
Glucose, Bld: 82 mg/dL (ref 70–99)
Potassium: 3.7 mmol/L (ref 3.5–5.1)
Sodium: 141 mmol/L (ref 135–145)

## 2019-09-15 LAB — CBC
HCT: 30.8 % — ABNORMAL LOW (ref 36.0–46.0)
Hemoglobin: 9.3 g/dL — ABNORMAL LOW (ref 12.0–15.0)
MCH: 21.1 pg — ABNORMAL LOW (ref 26.0–34.0)
MCHC: 30.2 g/dL (ref 30.0–36.0)
MCV: 70 fL — ABNORMAL LOW (ref 80.0–100.0)
Platelets: 285 10*3/uL (ref 150–400)
RBC: 4.4 MIL/uL (ref 3.87–5.11)
RDW: 18.2 % — ABNORMAL HIGH (ref 11.5–15.5)
WBC: 7 10*3/uL (ref 4.0–10.5)
nRBC: 0 % (ref 0.0–0.2)

## 2019-09-15 LAB — SURGICAL PCR SCREEN
MRSA, PCR: NEGATIVE
Staphylococcus aureus: NEGATIVE

## 2019-09-15 LAB — PROTIME-INR
INR: 1.1 (ref 0.8–1.2)
Prothrombin Time: 13.8 seconds (ref 11.4–15.2)

## 2019-09-15 LAB — APTT: aPTT: 25 seconds (ref 24–36)

## 2019-09-15 LAB — SARS CORONAVIRUS 2 BY RT PCR (HOSPITAL ORDER, PERFORMED IN ~~LOC~~ HOSPITAL LAB): SARS Coronavirus 2: NEGATIVE

## 2019-09-15 LAB — HCG, QUANTITATIVE, PREGNANCY: hCG, Beta Chain, Quant, S: <1 m[IU]/mL (ref <5)

## 2019-09-15 MED ORDER — LACTATED RINGERS IV SOLN: INTRAVENOUS | Status: DC

## 2019-09-15 MED ORDER — ONDANSETRON HCL 4 MG PO TABS
4.0000 mg | ORAL_TABLET | Freq: Four times a day (QID) | ORAL | Status: DC | PRN
  Filled 2019-09-15: qty 1

## 2019-09-15 MED ORDER — DIPHENHYDRAMINE HCL 12.5 MG/5ML PO ELIX: 12.5000 mg | ORAL_SOLUTION | ORAL | Status: DC | PRN

## 2019-09-15 MED ORDER — MAGNESIUM CITRATE PO SOLN
1.0000 | Freq: Once | ORAL | Status: DC | PRN
  Filled 2019-09-15: qty 296

## 2019-09-15 MED ORDER — HYDROCODONE-ACETAMINOPHEN 5-325 MG PO TABS: 1.0000 | ORAL_TABLET | ORAL | Status: DC | PRN

## 2019-09-15 MED ORDER — PROPRANOLOL HCL ER 60 MG PO CP24
60.0000 mg | ORAL_CAPSULE | Freq: Every day | ORAL | Status: DC
  Administered 2019-09-15: 60 mg via ORAL
  Filled 2019-09-15 (×2): qty 1

## 2019-09-15 MED ORDER — ONDANSETRON HCL 4 MG/2ML IJ SOLN
4.0000 mg | Freq: Once | INTRAMUSCULAR | Status: AC
  Administered 2019-09-15: 4 mg via INTRAVENOUS
  Filled 2019-09-15: qty 2

## 2019-09-15 MED ORDER — HYDROCODONE-ACETAMINOPHEN 7.5-325 MG PO TABS
1.0000 | ORAL_TABLET | ORAL | Status: DC | PRN
  Administered 2019-09-15: 1 via ORAL
  Filled 2019-09-15 (×2): qty 1

## 2019-09-15 MED ORDER — ONDANSETRON HCL 4 MG/2ML IJ SOLN
4.0000 mg | Freq: Four times a day (QID) | INTRAMUSCULAR | Status: DC | PRN
  Administered 2019-09-15: 4 mg via INTRAVENOUS
  Filled 2019-09-15: qty 2

## 2019-09-15 MED ORDER — FENTANYL CITRATE (PF) 100 MCG/2ML IJ SOLN
50.0000 ug | Freq: Once | INTRAMUSCULAR | Status: AC
  Administered 2019-09-15: 50 ug via INTRAVENOUS
  Filled 2019-09-15: qty 2

## 2019-09-15 MED ORDER — TEMAZEPAM 15 MG PO CAPS
15.0000 mg | ORAL_CAPSULE | Freq: Every evening | ORAL | Status: DC | PRN
  Administered 2019-09-15: 15 mg via ORAL
  Filled 2019-09-15: qty 1

## 2019-09-15 MED ORDER — HYDROMORPHONE HCL 1 MG/ML IJ SOLN: 0.5000 mg | Freq: Once | INTRAMUSCULAR | Status: AC

## 2019-09-15 MED ORDER — MORPHINE SULFATE (PF) 2 MG/ML IV SOLN
0.5000 mg | INTRAVENOUS | Status: DC | PRN
  Administered 2019-09-15 – 2019-09-16 (×5): 1 mg via INTRAVENOUS
  Filled 2019-09-15 (×5): qty 1

## 2019-09-15 MED ORDER — ACETAMINOPHEN 500 MG PO TABS
500.0000 mg | ORAL_TABLET | Freq: Four times a day (QID) | ORAL | Status: AC
  Administered 2019-09-15 (×2): 500 mg via ORAL
  Filled 2019-09-15 (×2): qty 1

## 2019-09-15 MED ORDER — METHOCARBAMOL 1000 MG/10ML IJ SOLN
500.0000 mg | Freq: Four times a day (QID) | INTRAVENOUS | Status: DC | PRN
  Filled 2019-09-15: qty 5

## 2019-09-15 MED ORDER — BISACODYL 10 MG RE SUPP: 10.0000 mg | Freq: Every day | RECTAL | Status: DC | PRN

## 2019-09-15 MED ORDER — HYDROMORPHONE HCL 1 MG/ML IJ SOLN
1.0000 mg | INTRAMUSCULAR | Status: AC
  Administered 2019-09-15: 1 mg via INTRAVENOUS
  Filled 2019-09-15: qty 1

## 2019-09-15 MED ORDER — LURASIDONE HCL 40 MG PO TABS
20.0000 mg | ORAL_TABLET | Freq: Every day | ORAL | Status: DC
  Administered 2019-09-15: 20 mg via ORAL
  Filled 2019-09-15 (×2): qty 1

## 2019-09-15 MED ORDER — CLINDAMYCIN PHOSPHATE 900 MG/50ML IV SOLN
900.0000 mg | INTRAVENOUS | Status: AC
  Administered 2019-09-16: 900 mg via INTRAVENOUS
  Filled 2019-09-15: qty 50

## 2019-09-15 MED ORDER — KETOROLAC TROMETHAMINE 15 MG/ML IJ SOLN
15.0000 mg | Freq: Four times a day (QID) | INTRAMUSCULAR | Status: AC
  Administered 2019-09-15 – 2019-09-16 (×4): 15 mg via INTRAVENOUS
  Filled 2019-09-15 (×3): qty 1

## 2019-09-15 MED ORDER — MUPIROCIN 2 % EX OINT
1.0000 "application " | TOPICAL_OINTMENT | Freq: Two times a day (BID) | CUTANEOUS | Status: DC
  Administered 2019-09-15: 1 via NASAL
  Filled 2019-09-15: qty 22

## 2019-09-15 MED ORDER — POLYETHYLENE GLYCOL 3350 17 G PO PACK: 17.0000 g | PACK | Freq: Every day | ORAL | Status: DC | PRN

## 2019-09-15 MED ORDER — METHOCARBAMOL 500 MG PO TABS
500.0000 mg | ORAL_TABLET | Freq: Four times a day (QID) | ORAL | Status: DC | PRN
  Administered 2019-09-15: 500 mg via ORAL
  Filled 2019-09-15: qty 1

## 2019-09-15 MED ORDER — PROMETHAZINE HCL 25 MG/ML IJ SOLN: 12.5000 mg | Freq: Four times a day (QID) | INTRAMUSCULAR | Status: DC | PRN

## 2019-09-15 MED ORDER — ACETAMINOPHEN 325 MG PO TABS: 325.0000 mg | ORAL_TABLET | Freq: Four times a day (QID) | ORAL | Status: DC | PRN

## 2019-09-15 MED ORDER — HYDROMORPHONE HCL 1 MG/ML IJ SOLN
INTRAMUSCULAR | Status: AC
  Administered 2019-09-15: 0.5 mg via INTRAVENOUS
  Filled 2019-09-15: qty 1

## 2019-09-15 NOTE — Plan of Care (Signed)
  Problem: Health Behavior/Discharge Planning: Goal: Ability to manage health-related needs will improve Outcome: Progressing   Problem: Clinical Measurements: Goal: Ability to maintain clinical measurements within normal limits will improve Outcome: Progressing   Problem: Activity: Goal: Risk for activity intolerance will decrease Outcome: Progressing   Problem: Nutrition: Goal: Adequate nutrition will be maintained Outcome: Progressing   Problem: Coping: Goal: Level of anxiety will decrease Outcome: Progressing   Problem: Elimination: Goal: Will not experience complications related to bowel motility Outcome: Progressing Goal: Will not experience complications related to urinary retention Outcome: Progressing   Problem: Pain Managment: Goal: General experience of comfort will improve Outcome: Progressing   Problem: Safety: Goal: Ability to remain free from injury will improve Outcome: Progressing   Problem: Skin Integrity: Goal: Risk for impaired skin integrity will decrease Outcome: Progressing   

## 2019-09-15 NOTE — ED Notes (Signed)
X-ray at bedside
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
## 2019-09-16 ENCOUNTER — Encounter: Payer: Self-pay | Admitting: Orthopedic Surgery

## 2019-09-16 ENCOUNTER — Observation Stay: Payer: Medicaid Other

## 2019-09-16 ENCOUNTER — Encounter
Admission: EM | Disposition: A | Discharge status: Still In Hospital | Payer: Self-pay | Source: Home / Self Care | Attending: Emergency Medicine

## 2019-09-16 ENCOUNTER — Observation Stay: Payer: Medicaid Other | Admitting: Anesthesiology

## 2019-09-16 HISTORY — PX: ORIF ANKLE FRACTURE: SHX5408

## 2019-09-16 SURGERY — OPEN REDUCTION INTERNAL FIXATION (ORIF) ANKLE FRACTURE
Anesthesia: General | Site: Ankle | Laterality: Right

## 2019-09-16 MED ORDER — DEXAMETHASONE SODIUM PHOSPHATE 10 MG/ML IJ SOLN
INTRAMUSCULAR | Status: AC
  Filled 2019-09-16: qty 1

## 2019-09-16 MED ORDER — LIDOCAINE HCL (CARDIAC) PF 100 MG/5ML IV SOSY
PREFILLED_SYRINGE | INTRAVENOUS | Status: DC | PRN
  Administered 2019-09-16: 100 mg via INTRAVENOUS

## 2019-09-16 MED ORDER — FENTANYL CITRATE (PF) 100 MCG/2ML IJ SOLN
INTRAMUSCULAR | Status: DC | PRN
  Administered 2019-09-16 (×2): 50 ug via INTRAVENOUS

## 2019-09-16 MED ORDER — ROCURONIUM BROMIDE 100 MG/10ML IV SOLN
INTRAVENOUS | Status: DC | PRN
  Administered 2019-09-16: 50 mg via INTRAVENOUS

## 2019-09-16 MED ORDER — DEXAMETHASONE SODIUM PHOSPHATE 10 MG/ML IJ SOLN
INTRAMUSCULAR | Status: DC | PRN
  Administered 2019-09-16: 10 mg via INTRAVENOUS

## 2019-09-16 MED ORDER — PROPOFOL 10 MG/ML IV BOLUS
INTRAVENOUS | Status: DC | PRN
  Administered 2019-09-16: 200 mg via INTRAVENOUS

## 2019-09-16 MED ORDER — METOCLOPRAMIDE HCL 10 MG PO TABS: 5.0000 mg | ORAL_TABLET | Freq: Three times a day (TID) | ORAL | Status: DC | PRN

## 2019-09-16 MED ORDER — ONDANSETRON HCL 4 MG/2ML IJ SOLN
INTRAMUSCULAR | Status: DC | PRN
  Administered 2019-09-16: 4 mg via INTRAVENOUS

## 2019-09-16 MED ORDER — HYDROCODONE-ACETAMINOPHEN 5-325 MG PO TABS: 1.0000 | ORAL_TABLET | ORAL | Status: DC | PRN

## 2019-09-16 MED ORDER — OXYCODONE HCL 10 MG PO TABS: 10.0000 mg | ORAL_TABLET | Freq: Four times a day (QID) | ORAL | 0 refills | Status: DC | PRN

## 2019-09-16 MED ORDER — ONDANSETRON HCL 4 MG/2ML IJ SOLN: 4.0000 mg | Freq: Once | INTRAMUSCULAR | Status: DC | PRN

## 2019-09-16 MED ORDER — ACETAMINOPHEN 325 MG PO TABS: 325.0000 mg | ORAL_TABLET | Freq: Four times a day (QID) | ORAL | Status: DC | PRN

## 2019-09-16 MED ORDER — KETOROLAC TROMETHAMINE 15 MG/ML IJ SOLN
15.0000 mg | Freq: Four times a day (QID) | INTRAMUSCULAR | Status: DC
  Administered 2019-09-16: 15 mg via INTRAVENOUS
  Filled 2019-09-16: qty 1

## 2019-09-16 MED ORDER — FENTANYL CITRATE (PF) 100 MCG/2ML IJ SOLN
INTRAMUSCULAR | Status: AC
  Filled 2019-09-16: qty 2

## 2019-09-16 MED ORDER — FENTANYL CITRATE (PF) 100 MCG/2ML IJ SOLN: 25.0000 ug | INTRAMUSCULAR | Status: DC | PRN

## 2019-09-16 MED ORDER — ONDANSETRON HCL 4 MG/2ML IJ SOLN: 4.0000 mg | Freq: Four times a day (QID) | INTRAMUSCULAR | Status: DC | PRN

## 2019-09-16 MED ORDER — METOCLOPRAMIDE HCL 5 MG/ML IJ SOLN: 5.0000 mg | Freq: Three times a day (TID) | INTRAMUSCULAR | Status: DC | PRN

## 2019-09-16 MED ORDER — ACETAMINOPHEN 10 MG/ML IV SOLN
INTRAVENOUS | Status: DC | PRN
  Administered 2019-09-16: 1000 mg via INTRAVENOUS

## 2019-09-16 MED ORDER — MORPHINE SULFATE (PF) 2 MG/ML IV SOLN: 0.5000 mg | INTRAVENOUS | Status: DC | PRN

## 2019-09-16 MED ORDER — ACETAMINOPHEN 10 MG/ML IV SOLN
INTRAVENOUS | Status: AC
  Filled 2019-09-16: qty 100

## 2019-09-16 MED ORDER — FENTANYL CITRATE (PF) 100 MCG/2ML IJ SOLN
INTRAMUSCULAR | Status: AC
  Administered 2019-09-16: 25 ug via INTRAVENOUS
  Filled 2019-09-16: qty 2

## 2019-09-16 MED ORDER — ROCURONIUM BROMIDE 10 MG/ML (PF) SYRINGE
PREFILLED_SYRINGE | INTRAVENOUS | Status: AC
  Filled 2019-09-16: qty 10

## 2019-09-16 MED ORDER — LIDOCAINE HCL (PF) 2 % IJ SOLN
INTRAMUSCULAR | Status: AC
  Filled 2019-09-16: qty 5

## 2019-09-16 MED ORDER — LACTATED RINGERS IV SOLN: INTRAVENOUS | Status: DC

## 2019-09-16 MED ORDER — PHENYLEPHRINE HCL (PRESSORS) 10 MG/ML IV SOLN
INTRAVENOUS | Status: DC | PRN
  Administered 2019-09-16 (×2): 100 ug via INTRAVENOUS
  Administered 2019-09-16: 200 ug via INTRAVENOUS
  Administered 2019-09-16 (×2): 100 ug via INTRAVENOUS

## 2019-09-16 MED ORDER — HYDROCODONE-ACETAMINOPHEN 7.5-325 MG PO TABS
1.0000 | ORAL_TABLET | ORAL | Status: DC | PRN
  Administered 2019-09-16: 1 via ORAL
  Filled 2019-09-16: qty 1

## 2019-09-16 MED ORDER — ONDANSETRON HCL 4 MG/2ML IJ SOLN
INTRAMUSCULAR | Status: AC
  Filled 2019-09-16: qty 2

## 2019-09-16 MED ORDER — SUGAMMADEX SODIUM 500 MG/5ML IV SOLN
INTRAVENOUS | Status: DC | PRN
  Administered 2019-09-16: 200 mg via INTRAVENOUS

## 2019-09-16 MED ORDER — KETOROLAC TROMETHAMINE 15 MG/ML IJ SOLN
INTRAMUSCULAR | Status: AC
  Filled 2019-09-16: qty 1

## 2019-09-16 MED ORDER — MIDAZOLAM HCL 2 MG/2ML IJ SOLN
INTRAMUSCULAR | Status: AC
  Filled 2019-09-16: qty 2

## 2019-09-16 MED ORDER — DOCUSATE SODIUM 100 MG PO CAPS: 100.0000 mg | ORAL_CAPSULE | Freq: Two times a day (BID) | ORAL | 2 refills | Status: DC

## 2019-09-16 MED ORDER — ONDANSETRON HCL 4 MG PO TABS: 4.0000 mg | ORAL_TABLET | Freq: Four times a day (QID) | ORAL | Status: DC | PRN

## 2019-09-16 MED ORDER — MIDAZOLAM HCL 2 MG/2ML IJ SOLN
INTRAMUSCULAR | Status: DC | PRN
  Administered 2019-09-16: 2 mg via INTRAVENOUS

## 2019-09-16 MED ORDER — SODIUM CHLORIDE 0.9 % IV SOLN: INTRAVENOUS | Status: DC | PRN

## 2019-09-16 SURGICAL SUPPLY — 57 items
BIT DRILL 2.5X110 QC LCP DISP (BIT) ×3 IMPLANT
BIT DRILL CANN 2.7X625 NONSTRL (BIT) ×3 IMPLANT
BIT DRILL LCP QC 2X140 (BIT) ×6 IMPLANT
BIT DRILL QC 3.5X110 (BIT) ×3 IMPLANT
BNDG COHESIVE 6X5 TAN STRL LF (GAUZE/BANDAGES/DRESSINGS) ×3 IMPLANT
BNDG ELASTIC 4X5.8 VLCR STR LF (GAUZE/BANDAGES/DRESSINGS) ×3 IMPLANT
BNDG ELASTIC 6X5.8 VLCR STR LF (GAUZE/BANDAGES/DRESSINGS) ×3 IMPLANT
BNDG ESMARK 6X12 TAN STRL LF (GAUZE/BANDAGES/DRESSINGS) ×3 IMPLANT
BRUSH SCRUB EZ  4% CHG (MISCELLANEOUS) ×4
BRUSH SCRUB EZ 4% CHG (MISCELLANEOUS) ×2 IMPLANT
CANISTER SUCT 1200ML W/VALVE (MISCELLANEOUS) ×3 IMPLANT
CHLORAPREP W/TINT 26 (MISCELLANEOUS) ×6 IMPLANT
COVER WAND RF STERILE (DRAPES) ×3 IMPLANT
CUFF TOURN SGL QUICK 34 (TOURNIQUET CUFF) ×2
CUFF TRNQT CYL 34X4.125X (TOURNIQUET CUFF) ×1 IMPLANT
DRAPE 3/4 80X56 (DRAPES) ×3 IMPLANT
DRAPE FLUOR MINI C-ARM 54X84 (DRAPES) ×3 IMPLANT
ELECT REM PT RETURN 9FT ADLT (ELECTROSURGICAL) ×3
ELECTRODE REM PT RTRN 9FT ADLT (ELECTROSURGICAL) ×1 IMPLANT
GAUZE SPONGE 4X4 12PLY STRL (GAUZE/BANDAGES/DRESSINGS) ×3 IMPLANT
GAUZE XEROFORM 1X8 LF (GAUZE/BANDAGES/DRESSINGS) ×3 IMPLANT
GLOVE INDICATOR 8.0 STRL GRN (GLOVE) ×9 IMPLANT
GLOVE SURG ORTHO 8.0 STRL STRW (GLOVE) ×9 IMPLANT
GOWN STRL REUS W/ TWL LRG LVL3 (GOWN DISPOSABLE) ×1 IMPLANT
GOWN STRL REUS W/ TWL XL LVL3 (GOWN DISPOSABLE) ×1 IMPLANT
GOWN STRL REUS W/TWL LRG LVL3 (GOWN DISPOSABLE) ×2
GOWN STRL REUS W/TWL XL LVL3 (GOWN DISPOSABLE) ×2
GUIDEWARE NON THREAD 1.25X150 (WIRE) ×6
GUIDEWIRE NON THREAD 1.25X150 (WIRE) ×2 IMPLANT
KIT TURNOVER KIT A (KITS) ×3 IMPLANT
NS IRRIG 1000ML POUR BTL (IV SOLUTION) ×3 IMPLANT
PACK EXTREMITY (MISCELLANEOUS) ×3 IMPLANT
PAD ABD DERMACEA PRESS 5X9 (GAUZE/BANDAGES/DRESSINGS) ×9 IMPLANT
PAD CAST CTTN 4X4 STRL (SOFTGOODS) ×1 IMPLANT
PADDING CAST COTTON 4X4 STRL (SOFTGOODS) ×2
PLATE FIBULA 5 HOLE RIGHT (Plate) ×3 IMPLANT
SCALPEL PROTECTED #10 DISP (BLADE) ×3 IMPLANT
SCALPEL PROTECTED #15 DISP (BLADE) ×3 IMPLANT
SCREW CANN S THRD/44 4.0 (Screw) ×6 IMPLANT
SCREW CORT LP ST 3.5X14 (Screw) ×3 IMPLANT
SCREW CORTEX 2.7 SLF-TPNG 16MM (Screw) ×6 IMPLANT
SCREW CORTEX 2.7 SLF-TPNG 18MM (Screw) ×3 IMPLANT
SCREW CORTEX LOW PRO 3.5X26 (Screw) ×3 IMPLANT
SCREW LOCK VA ST 2.7X18 (Screw) ×6 IMPLANT
SCREW LOCKING 2.7X16MM VA (Screw) ×9 IMPLANT
SCREW SELF TAP 14MM (Screw) ×3 IMPLANT
SPLINT CAST 1 STEP 5X30 WHT (MISCELLANEOUS) ×3 IMPLANT
SPONGE LAP 18X18 RF (DISPOSABLE) ×3 IMPLANT
STAPLER SKIN PROX 35W (STAPLE) ×3 IMPLANT
SUT VIC AB 2-0 SH 27 (SUTURE) ×4
SUT VIC AB 2-0 SH 27XBRD (SUTURE) ×2 IMPLANT
SUT VIC AB 3-0 SH 27 (SUTURE) ×4
SUT VIC AB 3-0 SH 27X BRD (SUTURE) ×2 IMPLANT
TAPE SURG TRANSPORE 1 IN (GAUZE/BANDAGES/DRESSINGS) ×1 IMPLANT
TAPE SURGICAL TRANSPORE 1 IN (GAUZE/BANDAGES/DRESSINGS) ×2
TOWEL OR 17X26 4PK STRL BLUE (TOWEL DISPOSABLE) ×9 IMPLANT
WASHER 7MM DIA (Washer) ×6 IMPLANT

## 2019-09-16 NOTE — H&P (Signed)
PREOPERATIVE H&P  Chief Complaint: right ankle fracture  HPI: Sharon Dean is a 24 y.o. female who presents for preoperative history and physical with a diagnosis of right ankle fracture. Symptoms are rated as moderate to severe, and have been worsening.  This is significantly impairing activities of daily living.  She has elected for surgical management.   Past Medical History:  Diagnosis Date  . Bipolar 1 disorder (HCC)   . Obesity (BMI 30.0-34.9)    History reviewed. No pertinent surgical history. Social History   Socioeconomic History  . Marital status: Single    Spouse name: Not on file  . Number of children: 2  . Years of education: 77  . Highest education level: Not on file  Occupational History  . Occupation: unemployed  Tobacco Use  . Smoking status: Never Smoker  . Smokeless tobacco: Never Used  Vaping Use  . Vaping Use: Never used  Substance and Sexual Activity  . Alcohol use: No  . Drug use: Never  . Sexual activity: Yes    Birth control/protection: I.U.D.    Comment: Mirena  Other Topics Concern  . Not on file  Social History Narrative  . Not on file   Social Determinants of Health   Financial Resource Strain:   . Difficulty of Paying Living Expenses:   Food Insecurity:   . Worried About Programme researcher, broadcasting/film/video in the Last Year:   . Barista in the Last Year:   Transportation Needs:   . Freight forwarder (Medical):   Marland Kitchen Lack of Transportation (Non-Medical):   Physical Activity:   . Days of Exercise per Week:   . Minutes of Exercise per Session:   Stress:   . Feeling of Stress :   Social Connections:   . Frequency of Communication with Friends and Family:   . Frequency of Social Gatherings with Friends and Family:   . Attends Religious Services:   . Active Member of Clubs or Organizations:   . Attends Banker Meetings:   Marland Kitchen Marital Status:    Family History  Problem Relation Age of Onset  . Hypertension Mother   .  Diabetes Maternal Grandmother   . Hypertension Maternal Grandmother   . Diabetes Maternal Aunt   . Hypertension Maternal Aunt   . Cancer Neg Hx   . AAA (abdominal aortic aneurysm) Neg Hx    Allergies  Allergen Reactions  . Penicillins Anaphylaxis, Other (See Comments) and Swelling    Throat Childhood allergy, mother told her she was allergic.     Prior to Admission medications   Medication Sig Start Date End Date Taking? Authorizing Provider  LATUDA 20 MG TABS tablet SMARTSIG:1 Tablet(s) By Mouth Every Evening 08/27/19  Yes [provider]  propranolol ER (INDERAL LA) 60 MG 24 hr capsule Take 60 mg by mouth at bedtime. 08/23/19  Yes [provider]     Positive ROS: All other systems have been reviewed and were otherwise negative with the exception of those mentioned in the HPI and as above.  Physical Exam: General: Alert, no acute distress Cardiovascular: Regular rate and rhythm, no murmurs rubs or gallops.  No pedal edema Respiratory: Clear to auscultation bilaterally, no wheezes rales or rhonchi. No cyanosis, no use of accessory musculature GI: No organomegaly, abdomen is soft and non-tender nondistended with positive bowel sounds. Skin: Skin intact, no lesions within the operative field. Neurologic: Sensation intact distally Psychiatric: Patient is competent for consent with normal mood and  affect Lymphatic: No axillary or cervical lymphadenopathy  MUSCULOSKELETAL: splint c/d/i, moves toes, sensation light touch intact, compartment soft  Assessment: right ankle fracture  Plan: Plan for Procedure(s): OPEN REDUCTION INTERNAL FIXATION (ORIF) ANKLE FRACTURE  I discussed the risks and benefits of surgery. The risks include but are not limited to infection, bleeding requiring blood transfusion, nerve or blood vessel injury, joint stiffness or loss of motion, persistent pain, weakness or instability, malunion, nonunion and hardware failure and the need for  further surgery. Medical risks include but are not limited to DVT and pulmonary embolism, myocardial infarction, stroke, pneumonia, respiratory failure and death. Patient understood these risks and wished to proceed.   Lyndle Herrlich, MD   09/16/2019 11:13 AM

## 2019-09-16 NOTE — Progress Notes (Signed)
Follow up on pt pain level. Pt in bed resting quietly with eyes close. Is arousal with physical touch. Resp. Reg no distress noticed. Right foot elevated on pillow to keep feels free floating off bed. Has IV in left ac iv fluids running as ordered. Pt is now NPO awaiting surgery in AM. Bed in low potion with call light in reach. Side rails up x3.

## 2019-09-16 NOTE — Anesthesia Preprocedure Evaluation (Addendum)
Anesthesia Evaluation  Patient identified by MRN, date of birth, ID band Patient awake    Reviewed: Allergy & Precautions, NPO status , Patient's Chart, lab work & pertinent test results  Airway Mallampati: II  TM Distance: >3 FB     Dental  (+) Teeth Intact   Pulmonary neg pulmonary ROS,    Pulmonary exam normal        Cardiovascular negative cardio ROS Normal cardiovascular exam     Neuro/Psych PSYCHIATRIC DISORDERS Bipolar Disorder negative neurological ROS     GI/Hepatic negative GI ROS, Neg liver ROS,   Endo/Other  negative endocrine ROS  Renal/GU negative Renal ROS  negative genitourinary   Musculoskeletal   Abdominal Normal abdominal exam  (+)   Peds negative pediatric ROS (+)  Hematology  (+) anemia ,   Anesthesia Other Findings Past Medical History: No date: Bipolar 1 disorder (HCC) No date: Obesity (BMI 30.0-34.9)  Reproductive/Obstetrics                             Anesthesia Physical Anesthesia Plan  ASA: II  Anesthesia Plan: General   Post-op Pain Management:    Induction: Intravenous  PONV Risk Score and Plan:   Airway Management Planned: Oral ETT  Additional Equipment:   Intra-op Plan:   Post-operative Plan: Extubation in OR  Informed Consent: I have reviewed the patients History and Physical, chart, labs and discussed the procedure including the risks, benefits and alternatives for the proposed anesthesia with the patient or authorized representative who has indicated his/her understanding and acceptance.     Dental advisory given  Plan Discussed with: CRNA and Surgeon  Anesthesia Plan Comments: (Patient refuses regional block.)       Anesthesia Quick Evaluation

## 2019-09-16 NOTE — Progress Notes (Signed)
Pt in bed alert and orientated x4. On cell phone talking loud . C/o pain medication not working. Pain uncontrolled to right lower legg.   Called Dr. Theone Murdoch to give po and IV pain medication together. Right lower leg in splint wrapped in ace bandage. Repostioned pt and lower extreamaties for comfort. Educated about breathing in through noise and out the mouth to control breathing to help with pain.Gave  Po PRN sleeping , pain norco 7.5mg  with PRN 1mg  Dilaudid and [place]  iv medication for server pain.

## 2019-09-16 NOTE — Progress Notes (Signed)
Went into pt room to give 0600 IV medications and pt was asleep. Pt woke up whet to scan her bractlet. Pt seams to be agitated and wants to take her ace wrap off her right lower leg. Explained to pt do not take ace wrap off of right foot. PT stopped unwrapping ace band and rewrapped it up , and elevated on pillow to float heels.

## 2019-09-16 NOTE — Op Note (Signed)
  09/16/2019  3:17 PM  PATIENT:  Sharon Dean  24 y.o. female  PRE-OPERATIVE DIAGNOSIS:  right ankle fracture  POST-OPERATIVE DIAGNOSIS:  Same  PROCEDURE:  OPEN REDUCTION INTERNAL FIXATION (ORIF) ANKLE FRACTURE  SURGEON:  Cassell Smiles, MD  ASST:  None  ANESTHESIA:   General  EBL:  10 mL  TOURNIQUET TIME:  52 min at 250 mmHg  OPERATIVE FINDINGS:  Unstable trimalleolar ankle fracture, moderate swelling and intact skin  OPERATIVE PROCEDURE:   The patient was brought to the operating room and placed in the supine position. All bony prominences were padded. General anesthesia was administered. The lower extremity was prepped and draped in the usual sterile fashion. The leg was elevated and exsanguinated and the tourniquet was inflated. Time out was performed.   Incision was made over the distal fibula and the fracture was exposed and reduced anatomically with a clamp. A lag screw was placed. I then applied a synthes locking plate and secured it proximally with cortical screws and distally with locking screws.  I used the Fluoroscan to confirm satisfactory reduction and fixation. This wound was irrigated and closed with vicryl sutures and staples.  I then turned my attention to the medial malleolus. Incision was made over the medial malleolus and the fracture exposed and held provisionally with a clamp. 2 guidepins were placed for the 4.0 mm cannulated screws and then confirmation of reduction was made with fluoroscopy. I then placed 2  44 mm screws which had satisfactory fixation. Fluoroscopy showed good reduction and hardware placement.   The syndesmosis was stressed using live fluoroscopy and found to be stable.   The medial wound was irrigated, and closed with vicryl and staples. Sponge and needle counts were correct. Sterile gauze was applied followed by a posterior splint. She was awakened and returned to the PACU in stable and satisfactory condition. There were no apparent  complications.  Cassell Smiles, MD

## 2019-09-16 NOTE — Transfer of Care (Signed)
Immediate Anesthesia Transfer of Care Note  Patient: Sharon Dean  Procedure(s) Performed: OPEN REDUCTION INTERNAL FIXATION (ORIF) ANKLE FRACTURE (Right Ankle)  Patient Location: PACU  Anesthesia Type:General  Level of Consciousness: sedated  Airway & Oxygen Therapy: Patient Spontanous Breathing and Patient connected to face mask oxygen  Post-op Assessment: Report given to RN and Post -op Vital signs reviewed and stable  Post vital signs: Reviewed and stable  Last Vitals:  Vitals Value Taken Time  BP 112/57 09/16/19 1515  Temp 36.7 C 09/16/19 1515  Pulse 72 09/16/19 1520  Resp 19 09/16/19 1520  SpO2 92 % 09/16/19 1520  Vitals shown include unvalidated device data.  Last Pain:  Vitals:   09/16/19 1515  TempSrc:   PainSc: Asleep      Patients Stated Pain Goal: 5 (09/15/19 2100)  Complications: No complications documented.

## 2019-09-16 NOTE — Discharge Instructions (Signed)
Ankle Fracture  The ankle joint is made up of the lower (distal) sections of your lower leg bones(tibia and fibula) along with a bone in your foot (talus). An ankle fracture is a break in one, two, or all three of these sections of bone. Follow these instructions at home: If you have a splint:  Wear the splint as told by your doctor. Take it off only as told by your doctor.  Loosen the splint if your toes tingle, become numb, or turn cold and blue.  Keep the splint clean.  If the splint is not waterproof: ? Do not let it get wet. ? Cover it with a watertight covering when you take a bath or a shower. If you have a cast:  Do not stick anything inside the cast to scratch your skin. Doing that increases your risk of infection.  Check the skin around the cast every day. Tell your doctor about any concerns.  You may put lotion on dry skin around the edges of the cast. Do not put lotion on the skin underneath the cast.  Keep the cast clean.  If the cast is not waterproof: ? Do not let it get wet. ? Cover it with a watertight covering when you take a bath or a shower. Managing pain, stiffness, and swelling  If directed, put ice on the injured area: ? If you have a removable splint, remove it as told by your doctor. ? Put ice in a plastic bag. ? Place a towel between your skin and the bag. ? Leave the ice on for 20 minutes, 2-3 times a day.  Move your toes often. This prevents stiffness and lessens swelling.  Raise (elevate) the injured area above the level of your heart while you are sitting or lying down. General instructions  Do not use the injured limb to support your body weight until your doctor says that you can. Use crutches as told by your doctor.  Take over-the-counter and prescription medicines only as told by your doctor.  Ask your doctor when it is safe to drive if you have a cast or splint.  Do exercises as told by your doctor.  Do not use any products that  contain nicotine or tobacco, such as cigarettes and e-cigarettes. These can delay bone healing. If you need help quitting, ask your doctor.  Keep all follow-up visits as told by your doctor. This is important. Contact a doctor if:  Your pain or swelling gets worse.  Your pain or swelling does not get better when you rest or take medicine. Get help right away if:  Your cast gets damaged.  You continue to have very bad pain.  You have new pain or swelling.  Your skin or toes below the injured ankle: ? Turn blue or gray. ? Feel cold or numb. ? Lose sensitivity to touch. Summary  An ankle fracture is a break in one, two, or all three of the bones in your lower leg and lower foot.  If you have a splint, wear it as told by your health care provider. Keep it clean and dry.  If you have a cast, do not stick anything inside the cast to scratch your skin. This can cause infection.  Use ice, take medicines, raise your foot, and avoid tobacco and nicotine products. These steps will lessen pain and swelling and speed up healing. This information is not intended to replace advice given to you by your health care provider. Make sure you discuss   any questions you have with your health care provider. Document Revised: 02/10/2017 Document Reviewed: 04/04/2016 Elsevier Patient Education  2020 [employer]vier [employer]  

## 2019-09-16 NOTE — Discharge Summary (Signed)
Physician Discharge Summary  Patient ID: Sharon Dean MRN: [id number] DOB/AGE: Feb 14, 1996 24 y.o.  Admit date: 09/15/2019 Discharge date: 09/16/2019  Admission Diagnoses:  right ankle fracture <principal problem not specified>  Discharge Diagnoses:  right ankle fracture Active Problems:   Closed right ankle fracture   Past Medical History:  Diagnosis Date  . Bipolar 1 disorder (HCC)   . Obesity (BMI 30.0-34.9)     Surgeries: Procedure(s): OPEN REDUCTION INTERNAL FIXATION (ORIF) ANKLE FRACTURE on 09/16/2019   Consultants (if any):   Discharged Condition: Improved  Hospital Course: Kareli Hossain is an 24 y.o. female who was admitted 09/15/2019 with a diagnosis of  right ankle fracture <principal problem not specified> and went to the operating room on 09/16/2019 and underwent the above named procedures.    She was given perioperative antibiotics:  Anti-infectives (From admission, onward)   Start     Dose/Rate Route Frequency Ordered Stop   09/16/19 1155  bacitracin 50,000 Units in sodium chloride 0.9 % 500 mL irrigation  Status:  Discontinued          As needed 09/16/19 1156 09/16/19 1511   09/16/19 0600  clindamycin (CLEOCIN) IVPB 900 mg        900 mg 100 mL/hr over 30 Minutes Intravenous 30 min pre-op 09/15/19 1711 09/16/19 1346    .  She was given sequential compression devices, early ambulation, and aspirin for DVT prophylaxis.  She benefited maximally from the hospital stay and there were no complications.    Recent vital signs:  Vitals:   09/16/19 1614 09/16/19 1627  BP: 115/74 104/74  Pulse: 63 69  Resp: 16 15  Temp:  98.4 F (36.9 C)  SpO2: 96% 95%    Recent laboratory studies:  Lab Results  Component Value Date   HGB 9.3 (L) 09/15/2019   HGB 7.9 (L) 10/20/2017   HGB 8.0 (L) 10/19/2017   Lab Results  Component Value Date   WBC 7.0 09/15/2019   PLT 285 09/15/2019   Lab Results  Component Value Date   INR 1.1 09/15/2019   Lab Results  Component  Value Date   NA 141 09/15/2019   K 3.7 09/15/2019   CL 113 (H) 09/15/2019   CO2 22 09/15/2019   BUN 7 09/15/2019   CREATININE 0.95 09/15/2019   GLUCOSE 82 09/15/2019    Discharge Medications:   Allergies as of 09/16/2019      Reactions   Penicillins Anaphylaxis, Other (See Comments), Swelling   Throat Childhood allergy, mother told her she was allergic.       Medication List    TAKE these medications   docusate sodium 100 MG capsule Commonly known as: Colace Take 1 capsule (100 mg total) by mouth 2 (two) times daily.   Latuda 20 MG Tabs tablet Generic drug: lurasidone SMARTSIG:1 Tablet(s) By Mouth Every Evening   Oxycodone HCl 10 MG Tabs Take 1 tablet (10 mg total) by mouth 4 (four) times daily as needed.   propranolol ER 60 MG 24 hr capsule Commonly known as: INDERAL LA Take 60 mg by mouth at bedtime.       Diagnostic Studies: DG Tibia/Fibula Right  Result Date: 09/15/2019 CLINICAL DATA:  Fall, deformity EXAM: RIGHT TIBIA AND FIBULA - 2 VIEW COMPARISON:  September 15, 2019 FINDINGS: Gross deformity of the RIGHT ankle with fracture dislocation better demonstrated on ankle evaluation compatible with trimalleolar fracture with posterior translation of the talus relative to distal tibia. Oblique fracture through the distal fibula, posterior distal  tibia with fracture and displacement. Also with displacement of medial malleolus approximately 6 mm inferiorly. No sign of proximal fibular fracture or fracture in the mid shaft of the fibula or the tibia. IMPRESSION: Gross deformity compatible with trimalleolar fracture dislocation of the RIGHT ankle, better demonstrated on ankle evaluation. No fracture of the proximal tibia or fibula. Electronically Signed   By: Donzetta Kohut M.D.   On: 09/15/2019 12:41   DG Ankle 2 Views Right  Result Date: 09/15/2019 CLINICAL DATA:  Post reduction. EXAM: RIGHT ANKLE - 2 VIEW COMPARISON:  Earlier same day. FINDINGS: Overlying cast obscures evaluation of  bony detail. Evidence of patient's displaced medial malleolar fracture with mild residual displacement at the fracture site. Reduction of posterior malleolar fracture with mild residual displacement at the fracture site. Reduction of patient's distal fibular fracture with near anatomic alignment on the AP view a mild residual posterior displacement of the distal fragment on the lateral view. Slight widening of the lateral ankle mortise. Remainder the exam is unchanged. IMPRESSION: Reduction of patient's trimalleolar fracture with mild residual displacement at the fracture sites as described. Electronically Signed   By: Elberta Fortis M.D.   On: 09/15/2019 14:25   DG Foot 2 Views Right  Result Date: 09/15/2019 CLINICAL DATA:  Fall. EXAM: RIGHT FOOT - 2 VIEW COMPARISON:  None. FINDINGS: Acute displaced trimalleolar fractures with posterior dislocation of the talus with respect to the tibial plafond peer no foot fracture. Diffuse soft tissue swelling about the ankle. IMPRESSION: 1. Acute displaced trimalleolar ankle fracture-dislocation. Electronically Signed   By: Obie Dredge M.D.   On: 09/15/2019 12:41   DG MINI C-ARM IMAGE ONLY  Result Date: 09/16/2019 There is no interpretation for this exam.  This order is for images obtained during a surgical procedure.  Please See "Surgeries" Tab for more information regarding the procedure.    Disposition: Discharge disposition: 01-Home or Self Care            Signed: Lyndle Herrlich ,MD 09/16/2019, 6:41 PM

## 2019-09-16 NOTE — Anesthesia Procedure Notes (Addendum)
Procedure Name: Intubation Date/Time: 09/16/2019 3:30 AM Performed by: Ginger Carne, CRNA Pre-anesthesia Checklist: Patient identified, Emergency Drugs available, Suction available, Patient being monitored and Timeout performed Patient Re-evaluated:Patient Re-evaluated prior to induction Oxygen Delivery Method: Circle system utilized Preoxygenation: Pre-oxygenation with 100% oxygen Induction Type: IV induction Ventilation: Mask ventilation without difficulty and Oral airway inserted - appropriate to patient size Laryngoscope Size: McGraph and 3 Grade View: Grade I Tube type: Oral Tube size: 7.0 mm Number of attempts: 1 Airway Equipment and Method: Stylet,  Video-laryngoscopy and Oral airway Placement Confirmation: ETT inserted through vocal cords under direct vision,  positive ETCO2 and breath sounds checked- equal and bilateral Secured at: 23 cm Tube secured with: Tape Dental Injury: Teeth and Oropharynx as per pre-operative assessment

## 2019-09-16 NOTE — Plan of Care (Signed)
Patient discharged.

## 2019-09-16 NOTE — Progress Notes (Signed)
Walker delivered to pt room per Dr. Odis Luster verbal order. Pt to be NWB.

## 2019-09-16 NOTE — Progress Notes (Signed)
Ch visited with Pt and Pt's friend as part of routine rounding. Pt asked for prayer before surgery. Ch prayed with Pt. They were grateful for visit.

## 2019-09-17 ENCOUNTER — Encounter: Payer: Self-pay | Admitting: Orthopedic Surgery

## 2019-09-17 NOTE — Anesthesia Postprocedure Evaluation (Signed)
Anesthesia Post Note  Patient: Sharon Dean  Procedure(s) Performed: OPEN REDUCTION INTERNAL FIXATION (ORIF) ANKLE FRACTURE (Right Ankle)  Patient location during evaluation: PACU Anesthesia Type: General Level of consciousness: awake and alert and oriented Pain management: pain level controlled Vital Signs Assessment: post-procedure vital signs reviewed and stable Respiratory status: spontaneous breathing Cardiovascular status: blood pressure returned to baseline Anesthetic complications: no   No complications documented.   Last Vitals:  Vitals:   09/16/19 1614 09/16/19 1627  BP: 115/74 104/74  Pulse: 63 69  Resp: 16 15  Temp:  36.9 C  SpO2: 96% 95%    Last Pain:  Vitals:   09/16/19 1959  TempSrc:   PainSc: 0-No pain                 [name]

## 2019-11-21 ENCOUNTER — Encounter: Payer: Self-pay | Admitting: Emergency Medicine

## 2019-11-21 ENCOUNTER — Other Ambulatory Visit: Payer: Self-pay

## 2019-11-21 ENCOUNTER — Emergency Department
Admission: EM | Admit: 2019-11-21 | Discharge: 2019-11-21 | Discharge status: Against Medical Advice | Payer: Medicaid Other | Attending: Emergency Medicine | Admitting: Emergency Medicine

## 2019-11-21 DIAGNOSIS — A64 Unspecified sexually transmitted disease: Secondary | ICD-10-CM | POA: Diagnosis not present

## 2019-11-21 DIAGNOSIS — Z5321 Procedure and treatment not carried out due to patient leaving prior to being seen by health care provider: Secondary | ICD-10-CM | POA: Insufficient documentation

## 2019-11-21 LAB — POCT PREGNANCY, URINE: Preg Test, Ur: NEGATIVE

## 2019-11-21 NOTE — ED Notes (Signed)
Patient states she would like a pregnancy test because she is two days late with her period.

## 2019-11-21 NOTE — ED Triage Notes (Signed)
Pt reports would like to have STD testing. Pt states not having sx's, just wants testing

## 2020-02-28 ENCOUNTER — Encounter: Payer: Self-pay | Admitting: Advanced Practice Midwife

## 2020-02-28 ENCOUNTER — Ambulatory Visit (LOCAL_COMMUNITY_HEALTH_CENTER): Payer: Medicaid Other | Admitting: Advanced Practice Midwife

## 2020-02-28 ENCOUNTER — Other Ambulatory Visit: Payer: Self-pay

## 2020-02-28 VITALS — BP 114/77 | Ht 67.0 in | Wt 260.6 lb

## 2020-02-28 DIAGNOSIS — I10 Essential (primary) hypertension: Secondary | ICD-10-CM | POA: Insufficient documentation

## 2020-02-28 DIAGNOSIS — F419 Anxiety disorder, unspecified: Secondary | ICD-10-CM

## 2020-02-28 DIAGNOSIS — Z3009 Encounter for other general counseling and advice on contraception: Secondary | ICD-10-CM | POA: Diagnosis not present

## 2020-02-28 DIAGNOSIS — I159 Secondary hypertension, unspecified: Secondary | ICD-10-CM | POA: Diagnosis not present

## 2020-02-28 DIAGNOSIS — J45909 Unspecified asthma, uncomplicated: Secondary | ICD-10-CM

## 2020-02-28 DIAGNOSIS — K589 Irritable bowel syndrome without diarrhea: Secondary | ICD-10-CM | POA: Diagnosis not present

## 2020-02-28 DIAGNOSIS — Z30011 Encounter for initial prescription of contraceptive pills: Secondary | ICD-10-CM

## 2020-02-28 DIAGNOSIS — F5089 Other specified eating disorder: Secondary | ICD-10-CM

## 2020-02-28 DIAGNOSIS — F4310 Post-traumatic stress disorder, unspecified: Secondary | ICD-10-CM

## 2020-02-28 DIAGNOSIS — F319 Bipolar disorder, unspecified: Secondary | ICD-10-CM

## 2020-02-28 LAB — HEMOGLOBIN, FINGERSTICK: Hemoglobin: 9.6 g/dL — ABNORMAL LOW (ref 11.1–15.9)

## 2020-02-28 LAB — WET PREP FOR TRICH, YEAST, CLUE
Trichomonas Exam: NEGATIVE
Yeast Exam: NEGATIVE

## 2020-02-28 MED ORDER — NORETHINDRONE 0.35 MG PO TABS: 1.0000 | ORAL_TABLET | Freq: Every day | ORAL | 13 refills | Status: DC

## 2020-02-28 MED ORDER — IRON (FERROUS SULFATE) 325 (65 FE) MG PO TABS: 1.0000 | ORAL_TABLET | Freq: Two times a day (BID) | ORAL | 0 refills | Status: AC

## 2020-02-28 NOTE — Progress Notes (Signed)
[facility] [address] Main Number: [phone]    Family Planning Visit- Initial Visit  Subjective:  Sharon Dean is a 24 y.o. SBF smoker  [id number] (8,4,2)   being seen today for an initial well woman visit and to discuss family planning options.  She is currently using None for pregnancy prevention. Patient reports she does not want a pregnancy in the next year.  Patient has the following medical conditions has Bipolar disorder without psychotic features (HCC); Supervision of high risk pregnancy, antepartum; Suicidal ideation; Obesity (BMI=40.8); Hypokalemia; Insufficient prenatal care; Low back pain; Closed right ankle fracture; Hypertension dx'd 07/2019; IBS (irritable bowel syndrome); Pica cornstarch; Asthma; Bipolar 1 disorder (HCC); Anxiety; and PTSD (post-traumatic stress disorder) dx'd 07/2019 on their problem list.  Chief Complaint  Patient presents with  . Contraception  . Annual Exam    Patient reports wants ocp's.  Last sex 02/14/20 without condom; with current partner x 4 years; 2 partners in last 3 mo.  LMP 02/16/20.  Living with her 3 kids.  Smoking 7-15 cpd.  Can't remember last pap.  Last MJ 1 wk ago.  Last ETOH 02/06/20 (8 drinks vodka)1x/mo.  Has been seeing counselor at [facility] x2 Sharon Dean) for anxiety, PTSD, Bipolar and was on Latuda but ran out 1 mo ago.  HTN dx'd 07/2019 and given Propanolol but ran out 1 month ago.  Desires referral to Sharon Cosier, LCSW.  Unemployed and not in school. PHQ-9=24 and c/o increased sleep, decreased appetite, easily angered, decreased motivation, difficulty with decisions.  Denies SI/HI/hx SA ever.  Patient denies current cornstarch pica (none in last 2 mo)  Body mass index is 40.82 kg/m. - Patient is eligible for diabetes screening based on BMI and age >72?  not applicable HA1C ordered? not applicable  Patient reports 2  partner/s in last year. Desires STI screening?   Yes  Has patient been screened once for HCV in the past?  No  No results found for: HCVAB  Does the patient have current drug use (including MJ), have a partner with drug use, and/or has been incarcerated since last result? Yes  If yes-- Screen for HCV through [facility]   Does the patient meet criteria for HBV testing? Yes  Criteria:  -Household, sexual or needle sharing contact with HBV -History of drug use -HIV positive -Those with known Hep C   Health Maintenance Due  Topic Date Due  . Hepatitis C Screening  Never done  . COVID-19 Vaccine (1) Never done  . PAP-Cervical Cytology Screening  Never done  . PAP SMEAR-Modifier  Never done  . INFLUENZA VACCINE  10/13/2019    Review of Systems  Eyes: Positive for blurred vision (only with h/a).  Neurological: Positive for headaches (daily always over right eye,-N&V,-audio,+visual scotoma & dizzy, achy jts).    The following portions of the patient's history were reviewed and updated as appropriate: allergies, current medications, past family history, past medical history, past social history, past surgical history and problem list. Problem list updated.   See flowsheet for other program required questions.  Objective:   Vitals:   02/28/20 1044  BP: 114/77  Weight: 260 lb 9.6 oz (118.2 kg)  Height: 5\' 7"  (1.702 m)    Physical Exam Constitutional:      Appearance: Normal appearance. She is obese.  HENT:     Head: Normocephalic and atraumatic.     Mouth/Throat:     Mouth: Mucous membranes  are moist.  Eyes:     Conjunctiva/sclera: Conjunctivae normal.  Cardiovascular:     Rate and Rhythm: Normal rate and regular rhythm.  Pulmonary:     Effort: Pulmonary effort is normal.     Breath sounds: Normal breath sounds.  Chest:  Breasts:     Right: Normal.     Left: Normal.    Abdominal:     Palpations: Abdomen is soft.     Comments: Poor tone, soft without masses or tenderness, increased adipose  Genitourinary:     General: Normal vulva.     Exam position: Lithotomy position.     Vagina: Vaginal discharge (white creamy luekorrhea, ph<4.5) present.     Cervix: Normal.     Uterus: Normal.      Adnexa: Right adnexa normal and left adnexa normal.     Rectum: Normal.  Musculoskeletal:        General: Normal range of motion.     Cervical back: Normal range of motion and neck supple.  Skin:    General: Skin is warm and dry.  Neurological:     Mental Status: She is alert.  Psychiatric:        Mood and Affect: Mood normal.       Assessment and Plan:  Sharon Dean is a 24 y.o. female presenting to the [facility] Department for an initial well woman exam/family planning visit  Contraception counseling: Reviewed all forms of birth control options in the tiered based approach. available including abstinence; over the counter/barrier methods; hormonal contraceptive medication including pill, patch, ring, injection,contraceptive implant, ECP; hormonal and nonhormonal IUDs; permanent sterilization options including vasectomy and the various tubal sterilization modalities. Risks, benefits, and typical effectiveness rates were reviewed.  Questions were answered.  Written information was also given to the patient to review.  Patient desires ocp's, this was prescribed for patient. She will follow up in  for surveillance.  She was told to call with any further questions, or with any concerns about this method of contraception.  Emphasized use of condoms 100% of the time for STI prevention.  Patient was not offered ECP. ECP was not accepted by the patient. ECP counseling was not given - see RN documentation  1. Family planning Treat wet mount per standing orders  Immunization nurse consult - Hemoglobin, venipuncture - WET PREP FOR TRICH, YEAST, CLUE - Syphilis Serology, [place] [facility] - HIV/HCV [facility] - Chlamydia/Gonorrhea [facility] [facility] - Pap IG (Image Guided) - Ambulatory referral to Behavioral  Health  2. Encounter for initial prescription of contraceptive pills Micronor #13 I po daily to begin today e-rx'd Please give brochure on Nexplanon Please counsel on taking ocp I daily at same time and abstinance/back up condoms next 7 days Please give condoms to pt  3. Secondary hypertension On meds  4. Irritable bowel syndrome, unspecified type   5. Pica cornstarch   6. Asthma, unspecified asthma severity, unspecified whether complicated, unspecified whether persistent   7. Bipolar 1 disorder (HCC) Please give Sharon Dean's contact info to pt  8. Anxiety   9. PTSD (post-traumatic stress disorder) dx'd 07/2019      No follow-ups on file.  No future appointments.  Alberteen Spindle, CNM

## 2020-02-28 NOTE — Addendum Note (Signed)
Addended by: Burt Knack on: 02/28/2020 03:47 PM   Modules accepted: Orders

## 2020-02-28 NOTE — Progress Notes (Signed)
Patient here for PE, STD testing and wants to start OC. States had Pap test 3 years ago in [place]. Unable to find a pap at any location in care everywhere.Burt Knack, RN

## 2020-02-28 NOTE — Addendum Note (Signed)
Addended by: Arnetha Courser on: 02/28/2020 02:54 PM   Modules accepted: Orders

## 2020-02-28 NOTE — Progress Notes (Signed)
Wet mount reviewed, no treatment indicated. Patient Hgb 9.6 today, given ferrous sulfate and counseled to take 1 tablet by mouth twice daily with vitamin C juice. Patient scheduled to return for FP Acute visit in one month to assess Hgb and use of OC. Patient declined to take nexplanon brochure, and counseled on how to use OC correctly and effectively. Patient states understanding and signed OC consent. Sharon Dean card given.Burt Knack, RN

## 2020-03-03 LAB — PAP IG (IMAGE GUIDED): PAP Smear Comment: 0

## 2020-03-04 LAB — HM HEPATITIS C SCREENING LAB: HM Hepatitis Screen: NEGATIVE

## 2020-03-04 LAB — HM HIV SCREENING LAB: HM HIV Screening: NEGATIVE

## 2020-03-27 ENCOUNTER — Ambulatory Visit: Payer: Self-pay

## 2020-05-18 ENCOUNTER — Encounter: Payer: Self-pay | Admitting: Advanced Practice Midwife

## 2020-05-18 ENCOUNTER — Telehealth: Payer: Self-pay

## 2020-05-18 ENCOUNTER — Telehealth: Payer: Medicaid Other | Admitting: Physician Assistant

## 2020-05-18 DIAGNOSIS — G8929 Other chronic pain: Secondary | ICD-10-CM

## 2020-05-18 DIAGNOSIS — M25571 Pain in right ankle and joints of right foot: Secondary | ICD-10-CM | POA: Diagnosis not present

## 2020-05-18 MED ORDER — GABAPENTIN 100 MG PO CAPS: 100.0000 mg | ORAL_CAPSULE | Freq: Three times a day (TID) | ORAL | 0 refills | Status: DC

## 2020-05-18 NOTE — Progress Notes (Signed)
[name], Sharon Dean are scheduled for a virtual visit with your provider today.    Just as we do with appointments in the office, we must obtain your consent to participate.  Your consent will be active for this visit and any virtual visit you may have with one of our providers in the next 365 days.    If you have a MyChart account, I can also send a copy of this consent to you electronically.  All virtual visits are billed to your insurance company just like a traditional visit in the office.  As this is a virtual visit, video technology does not allow for your provider to perform a traditional examination.  This may limit your provider's ability to fully assess your condition.  If your provider identifies any concerns that need to be evaluated in person or the need to arrange testing such as labs, EKG, etc, we will make arrangements to do so.    Although advances in technology are sophisticated, we cannot ensure that it will always work on either your end or our end.  If the connection with a video visit is poor, we may have to switch to a telephone visit.  With either a video or telephone visit, we are not always able to ensure that we have a secure connection.   I need to obtain your verbal consent now.   Are you willing to proceed with your visit today?   Sharon Dean has provided verbal consent on 05/18/2020 for a virtual visit (video or telephone).   Piedad Climes, PA-C 05/18/2020  11:35 AM    Virtual Visit via Video   I connected with patient on 05/18/20 at 11:45 AM EST by a video enabled telemedicine application and verified that I am speaking with the correct person using two identifiers.  Location patient: Home Location provider: [facility] Persons participating in the virtual visit: Patient, Provider  I discussed the limitations of evaluation and management by telemedicine and the availability of in person appointments. The patient expressed understanding and agreed  to proceed.  Subjective:   HPI:   Patient presents via [facility] today complaining of ongoing right ankle pain over the past 1-1/2 years after suffering a trimalleolar fracture last July, subsequently having surgical repair with orthopedic surgery (Dr. Odis Luster).  Note she has had pain ever since, initially improving the further she got out from her surgery, but notes over the past year has only been worsening.  Endorses substantial aching, itching and burning pain.  Notes she can bear weight on the ankle but it causes significant pain which she ranks a 10 out of 10.  Pain wakes her up from sleep at night.  Notes her foot will swell easily if she is up on the leg for a long.  Initially was given oxycodone but states she did not take much of this as she does not want to take this kind of medicine.  Is not taking anything over-the-counter.  States she is only wanting her household but can drive and she currently cannot drive herself all the way to her orthopedist.  Has not reached out to them for further evaluation.  ROS:   See pertinent positives and negatives per HPI.  Patient Active Problem List   Diagnosis Date Noted  . Hypertension dx'd 07/2019 02/28/2020  . IBS (irritable bowel syndrome) 02/28/2020  . Pica cornstarch 02/28/2020  . Asthma 02/28/2020  . Bipolar 1 disorder (HCC) 02/28/2020  . Anxiety 02/28/2020  . PTSD (  post-traumatic stress disorder) dx'd 07/2019 02/28/2020  . Closed right ankle fracture 09/15/2019  . Low back pain 10/07/2017  . Insufficient prenatal care 10/03/2017  . Hypokalemia 10/01/2017  . Obesity (BMI=40.8)   . Supervision of high risk pregnancy, antepartum 06/19/2017  . 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 Suicidal ideation 12/05/2014    Social History   Tobacco Use  . Smoking status: Current Every Day Smoker    Packs/day: 0.25    Types: Cigarettes  . Smokeless tobacco: Never Used  Substance Use Topics  . Alcohol use: Yes     Alcohol/week: 8.0 standard drinks    Types: 8 Shots of liquor per week    Comment: 8 drinks vodka last use 02/06/20    Current Outpatient Medications:  .  docusate sodium (COLACE) 100 MG capsule, Take 1 capsule (100 mg total) by mouth 2 (two) times daily. (Patient not taking: Reported on 02/28/2020), Disp: 30 capsule, Rfl: 2 .  Iron, Ferrous Sulfate, 325 (65 Fe) MG TABS, Take 1 tablet by mouth 2 (two) times daily., Disp: 100 tablet, Rfl: 0 .  LATUDA 20 MG TABS tablet, SMARTSIG:1 Tablet(s) By Mouth Every Evening (Patient not taking: Reported on 02/28/2020), Disp: , Rfl:  .  norethindrone (MICRONOR) 0.35 MG tablet, Take 1 tablet (0.35 mg total) by mouth daily., Disp: 28 tablet, Rfl: 13 .  Oxycodone HCl 10 MG TABS, Take 1 tablet (10 mg total) by mouth 4 (four) times daily as needed. (Patient not taking: Reported on 02/28/2020), Disp: 30 tablet, Rfl: 0 .  propranolol ER (INDERAL LA) 60 MG 24 hr capsule, Take 60 mg by mouth at bedtime. (Patient not taking: Reported on 02/28/2020), Disp: , Rfl:   Allergies  Allergen Reactions  . Penicillins Anaphylaxis, Other (See Comments) and Swelling    Throat Childhood allergy, mother told her she was allergic.      Objective:   There were no vitals taken for this visit.  Patient is well-developed, well-nourished in no acute distress.  Resting comfortably at home.  Head is normocephalic, atraumatic.  No labored breathing.  Speech is clear and coherent with logical content.  Patient is alert and oriented at baseline.   Assessment and Plan:   1. Chronic pain of right ankle Status post trimalleolar fracture and surgical correction.  This is an ongoing issue.  Discussed with her that she needs a further evaluation with her orthopedist and she needs to reach out to them as she does need examination and imaging.  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  We will start her on low-dose gabapentin 100 mg 3 times daily  while she is getting things worked out with her orthopedist.  Supportive measures and OTC medications reviewed.  Discussed with her that if orthopedist cannot see her in pain is a significant she needs to be seen at the ER or at least at an urgent care where she can be examined since she does not have a primary care provider.  Patient voiced understanding and agreement with plan.    Piedad Climes, [place] 05/18/2020

## 2020-05-18 NOTE — Telephone Encounter (Signed)
Call to patient, patient requesting TR from 02/2020. RN discussed all results. All questions answered.   Harvie Heck, RN

## 2020-06-09 ENCOUNTER — Other Ambulatory Visit: Payer: Self-pay | Admitting: Physician Assistant

## 2020-06-10 ENCOUNTER — Other Ambulatory Visit: Payer: Self-pay | Admitting: Family

## 2020-06-10 MED ORDER — GABAPENTIN 100 MG PO CAPS: 100.0000 mg | ORAL_CAPSULE | Freq: Three times a day (TID) | ORAL | 0 refills | Status: AC

## 2020-06-10 MED ORDER — GABAPENTIN 100 MG PO CAPS: 100.0000 mg | ORAL_CAPSULE | Freq: Three times a day (TID) | ORAL | 0 refills | Status: DC

## 2020-06-23 ENCOUNTER — Ambulatory Visit: Payer: Medicaid Other

## 2020-07-02 ENCOUNTER — Ambulatory Visit: Payer: Medicaid Other

## 2020-07-22 ENCOUNTER — Other Ambulatory Visit: Payer: Self-pay

## 2020-07-22 ENCOUNTER — Encounter: Payer: Self-pay | Admitting: Emergency Medicine

## 2020-07-22 ENCOUNTER — Emergency Department
Admission: EM | Admit: 2020-07-22 | Discharge: 2020-07-22 | Disposition: A | Discharge status: Home / Self Care | Payer: Medicaid Other | Attending: Emergency Medicine | Admitting: Emergency Medicine

## 2020-07-22 DIAGNOSIS — Z87891 Personal history of nicotine dependence: Secondary | ICD-10-CM | POA: Insufficient documentation

## 2020-07-22 DIAGNOSIS — J45909 Unspecified asthma, uncomplicated: Secondary | ICD-10-CM | POA: Insufficient documentation

## 2020-07-22 DIAGNOSIS — Z046 Encounter for general psychiatric examination, requested by authority: Secondary | ICD-10-CM | POA: Diagnosis present

## 2020-07-22 DIAGNOSIS — I10 Essential (primary) hypertension: Secondary | ICD-10-CM | POA: Insufficient documentation

## 2020-07-22 DIAGNOSIS — Z760 Encounter for issue of repeat prescription: Secondary | ICD-10-CM | POA: Insufficient documentation

## 2020-07-22 DIAGNOSIS — F319 Bipolar disorder, unspecified: Secondary | ICD-10-CM | POA: Insufficient documentation

## 2020-07-22 HISTORY — DX: Depression, unspecified: F32.A

## 2020-07-22 HISTORY — DX: Anxiety disorder, unspecified: F41.9
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
## 2020-07-22 NOTE — ED Notes (Signed)
E-signature not working at this time. Pt verbalized understanding of D/C instructions, prescriptions and follow up care with no further questions at this time. Pt in NAD and ambulatory at time of D/C.  

## 2020-10-01 ENCOUNTER — Ambulatory Visit: Payer: Medicaid Other

## 2020-10-14 ENCOUNTER — Ambulatory Visit: Payer: Medicaid Other

## 2020-12-14 ENCOUNTER — Ambulatory Visit: Payer: Medicaid Other

## 2020-12-18 ENCOUNTER — Ambulatory Visit: Payer: Medicaid Other

## 2021-01-24 IMAGING — DX DG TIBIA/FIBULA 2V*R*
5 series · 5 of 5 positions shown · non-contrast
Comparison: September 15, 2019

CLINICAL DATA: Fall, deformity

EXAM:
RIGHT TIBIA AND FIBULA - 2 VIEW

[tibia ap (1 of 3)]
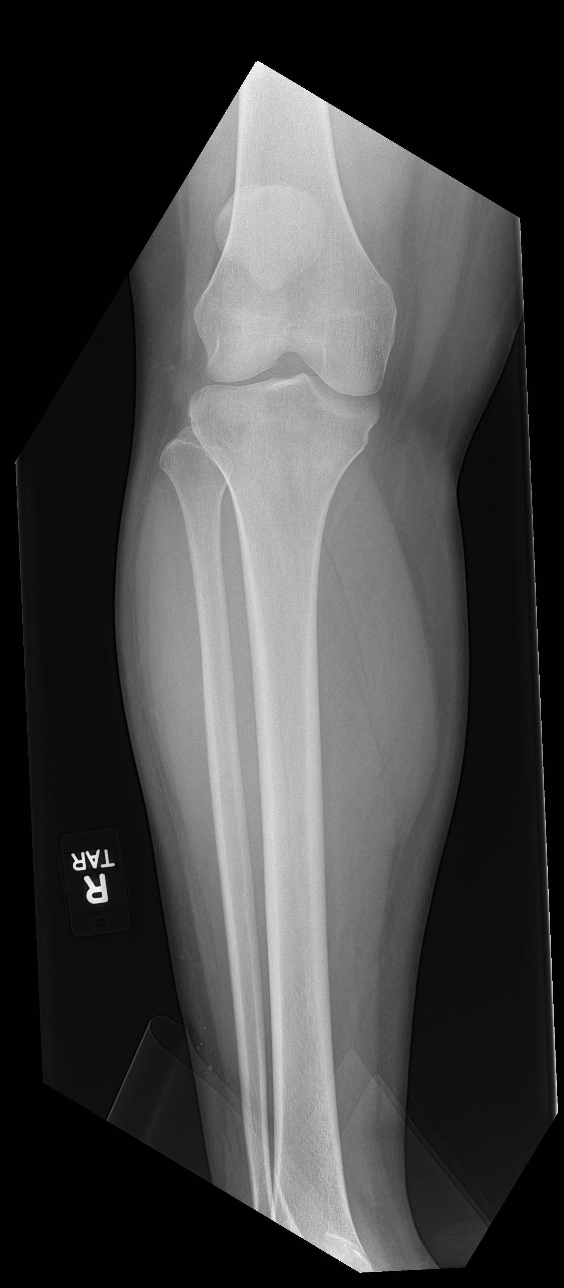

[tibia ap (2 of 3)]
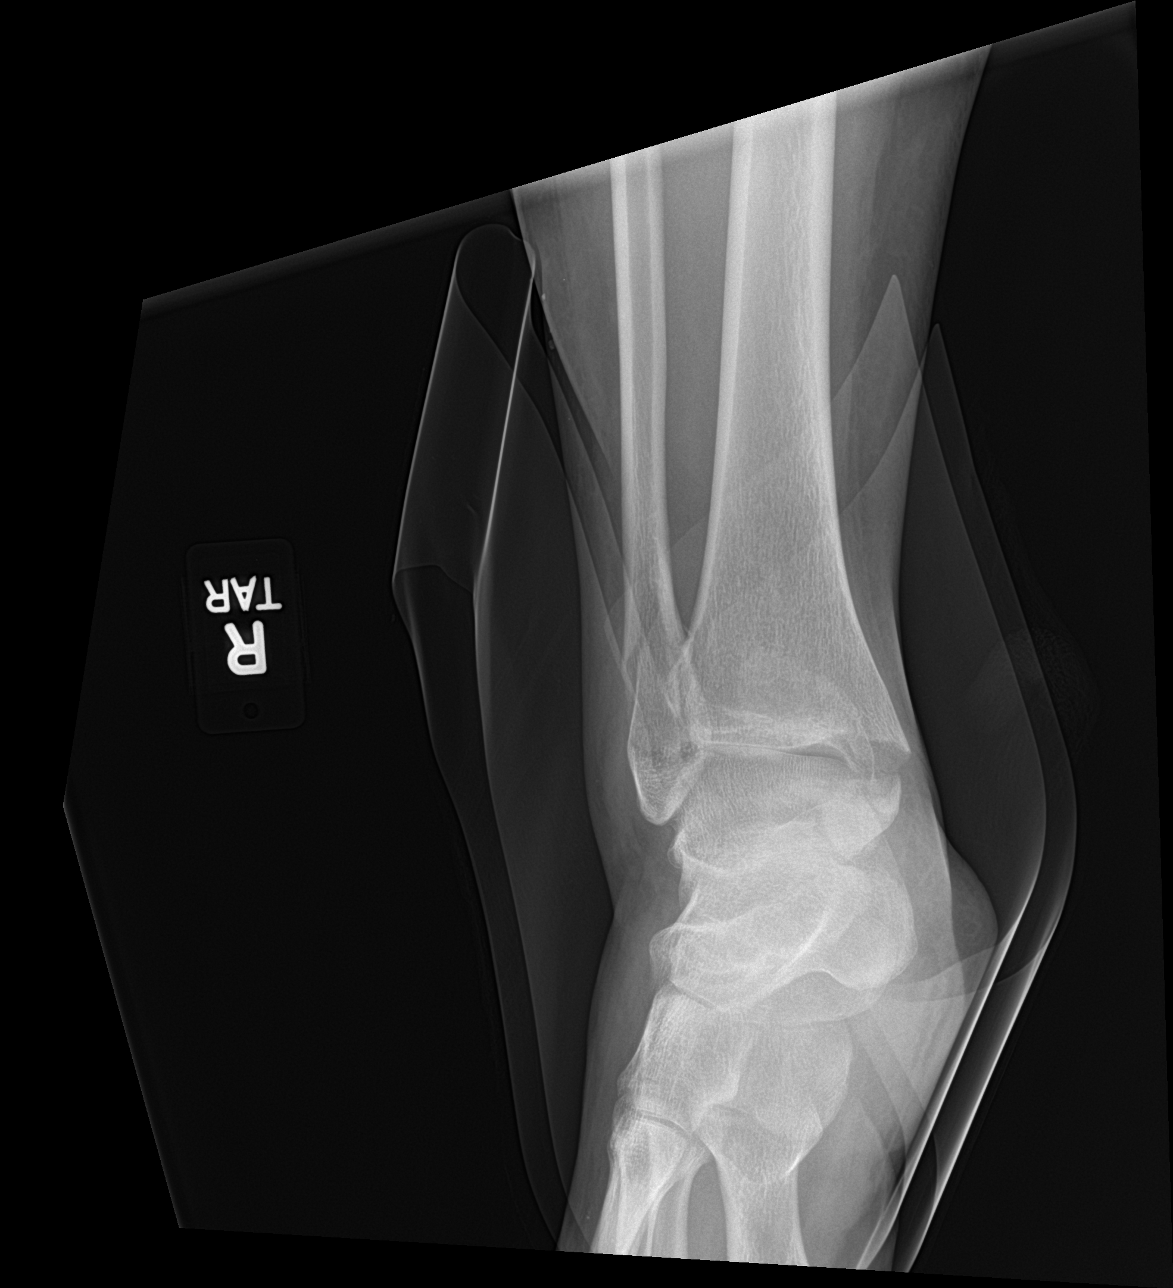

[tibia lat (1 of 2)]
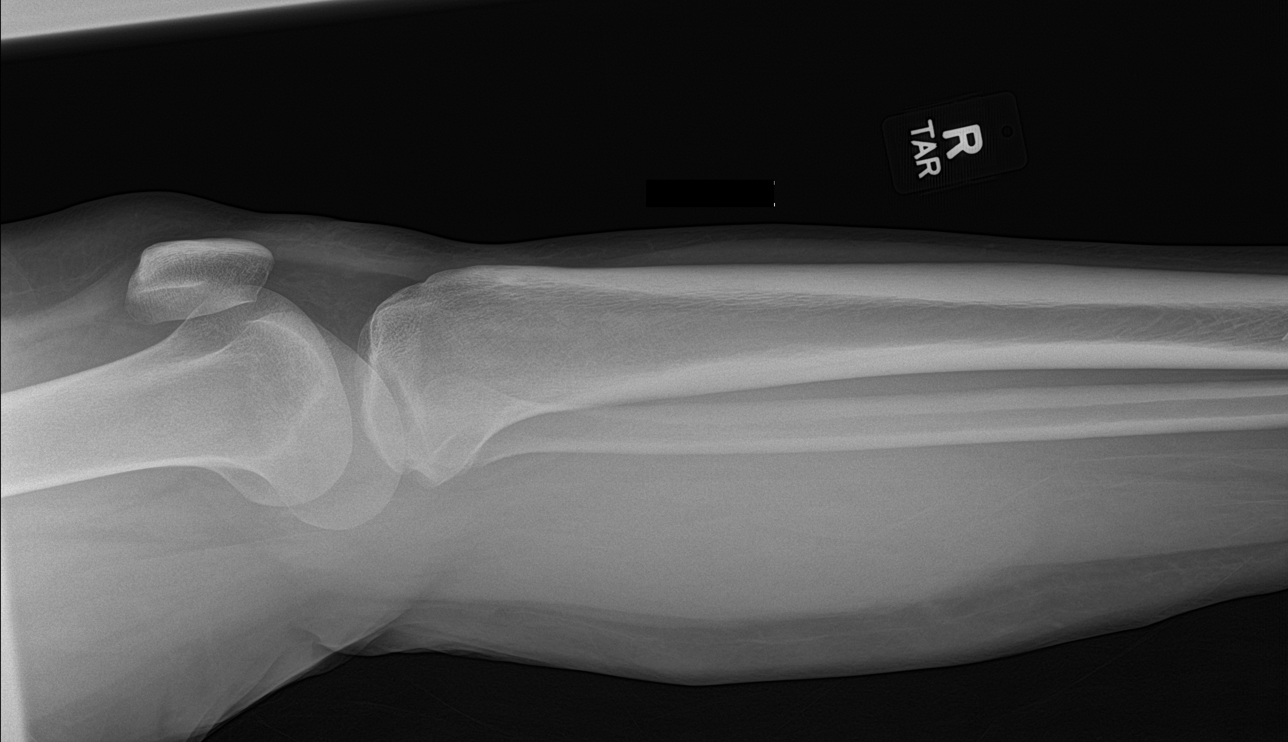

[tibia lat (2 of 2)]
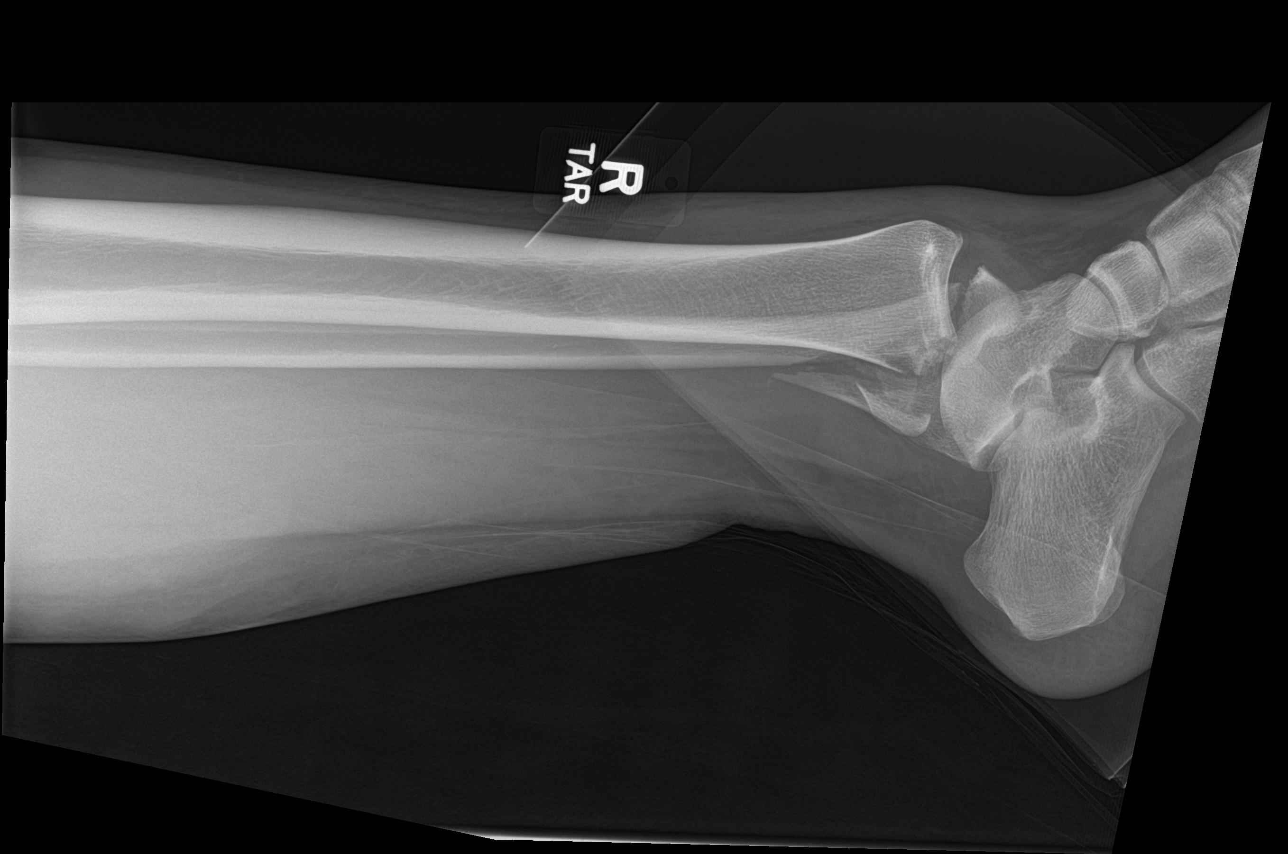

[tibia ap (3 of 3)]
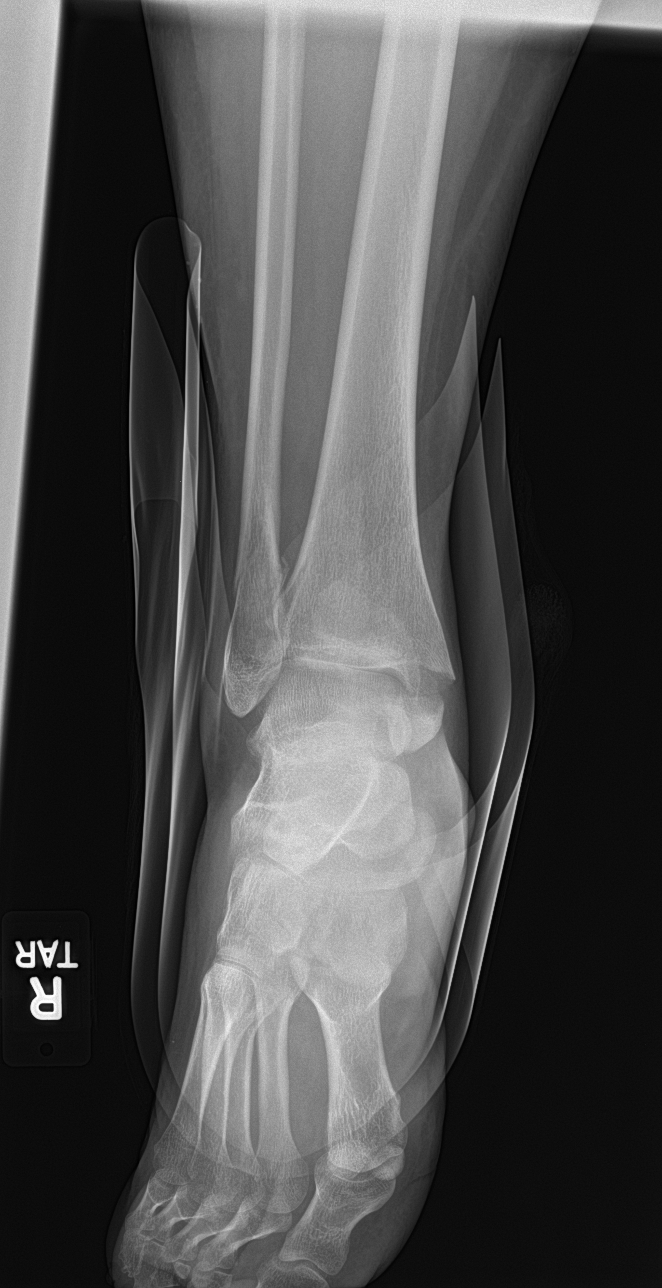

[5 of 5 positions shown; findings below may reference images not displayed]

FINDINGS: Gross deformity of the RIGHT ankle with fracture dislocation better
demonstrated on ankle evaluation compatible with trimalleolar
fracture with posterior translation of the talus relative to distal
tibia.

Oblique fracture through the distal fibula, posterior distal tibia
with fracture and displacement. Also with displacement of medial
malleolus approximately 6 mm inferiorly.

No sign of proximal fibular fracture or fracture in the mid shaft of
the fibula or the tibia.
IMPRESSION: Gross deformity compatible with trimalleolar fracture dislocation of
the RIGHT ankle, better demonstrated on ankle evaluation.

No fracture of the proximal tibia or fibula.
